# Patient Record
Sex: Male | Born: 1940 | Race: White | Hispanic: No | Marital: Married | Smoking: Former smoker
Health system: Southern US, Community
[De-identification: ages and names within clinical notes are randomized; demographics above are authoritative.]

## PROBLEM LIST (undated history)

## (undated) DIAGNOSIS — IMO0002 Reserved for concepts with insufficient information to code with codable children: Secondary | ICD-10-CM

## (undated) DIAGNOSIS — E785 Hyperlipidemia, unspecified: Secondary | ICD-10-CM

## (undated) DIAGNOSIS — C61 Malignant neoplasm of prostate: Secondary | ICD-10-CM

## (undated) DIAGNOSIS — N138 Other obstructive and reflux uropathy: Secondary | ICD-10-CM

## (undated) DIAGNOSIS — R351 Nocturia: Secondary | ICD-10-CM

## (undated) DIAGNOSIS — N401 Enlarged prostate with lower urinary tract symptoms: Secondary | ICD-10-CM

## (undated) DIAGNOSIS — M2550 Pain in unspecified joint: Secondary | ICD-10-CM

## (undated) DIAGNOSIS — IMO0001 Reserved for inherently not codable concepts without codable children: Secondary | ICD-10-CM

## (undated) DIAGNOSIS — Z972 Presence of dental prosthetic device (complete) (partial): Secondary | ICD-10-CM

## (undated) HISTORY — DX: Benign prostatic hyperplasia with lower urinary tract symptoms: N40.1

## (undated) HISTORY — PX: ESOPHAGOSCOPY: SUR460

## (undated) HISTORY — PX: POLYPECTOMY: SHX149

## (undated) HISTORY — PX: TONSILLECTOMY: SUR1361

## (undated) HISTORY — DX: Other obstructive and reflux uropathy: N13.8

## (undated) HISTORY — PX: CHOLECYSTECTOMY: SHX55

## (undated) HISTORY — DX: Hyperlipidemia, unspecified: E78.5

## (undated) HISTORY — DX: Malignant neoplasm of prostate: C61

## (undated) HISTORY — DX: Pain in unspecified joint: M25.50

## (undated) HISTORY — DX: Reserved for inherently not codable concepts without codable children: IMO0001

## (undated) HISTORY — PX: APPENDECTOMY: SHX54

## (undated) HISTORY — DX: Reserved for concepts with insufficient information to code with codable children: IMO0002

---

## 2002-07-19 ENCOUNTER — Encounter: Payer: Self-pay | Admitting: Internal Medicine

## 2002-07-19 ENCOUNTER — Encounter: Admission: RE | Admit: 2002-07-19 | Discharge: 2002-07-19 | Payer: Self-pay | Admitting: Internal Medicine

## 2005-02-02 ENCOUNTER — Ambulatory Visit: Payer: Self-pay | Admitting: Internal Medicine

## 2005-07-13 ENCOUNTER — Ambulatory Visit: Payer: Self-pay | Admitting: Unknown Physician Specialty

## 2006-05-15 ENCOUNTER — Ambulatory Visit: Payer: Self-pay | Admitting: Internal Medicine

## 2006-05-31 ENCOUNTER — Ambulatory Visit: Payer: Self-pay | Admitting: Internal Medicine

## 2007-03-14 ENCOUNTER — Ambulatory Visit: Payer: Self-pay | Admitting: Internal Medicine

## 2008-06-25 ENCOUNTER — Ambulatory Visit: Payer: Self-pay | Admitting: Unknown Physician Specialty

## 2008-07-23 ENCOUNTER — Ambulatory Visit: Payer: Self-pay | Admitting: Otolaryngology

## 2012-05-01 DIAGNOSIS — C61 Malignant neoplasm of prostate: Secondary | ICD-10-CM

## 2012-05-01 HISTORY — PX: PROSTATE BIOPSY: SHX241

## 2012-05-01 HISTORY — DX: Malignant neoplasm of prostate: C61

## 2012-06-27 ENCOUNTER — Encounter: Payer: Self-pay | Admitting: *Deleted

## 2012-06-27 DIAGNOSIS — N401 Enlarged prostate with lower urinary tract symptoms: Secondary | ICD-10-CM | POA: Insufficient documentation

## 2012-06-27 DIAGNOSIS — E785 Hyperlipidemia, unspecified: Secondary | ICD-10-CM | POA: Insufficient documentation

## 2012-06-27 DIAGNOSIS — M2550 Pain in unspecified joint: Secondary | ICD-10-CM | POA: Insufficient documentation

## 2012-06-27 DIAGNOSIS — C61 Malignant neoplasm of prostate: Secondary | ICD-10-CM | POA: Insufficient documentation

## 2012-06-27 NOTE — Progress Notes (Signed)
Married, retired from Public relations account executive- [employer]  03/09/12 PSA 5.66

## 2012-06-28 ENCOUNTER — Encounter: Payer: Self-pay | Admitting: Radiation Oncology

## 2012-06-28 ENCOUNTER — Ambulatory Visit
Admission: RE | Admit: 2012-06-28 | Discharge: 2012-06-28 | Disposition: A | Discharge status: Home / Self Care | Payer: Medicare Other | Source: Ambulatory Visit | Attending: Radiation Oncology | Admitting: Radiation Oncology

## 2012-06-28 VITALS — BP 129/83 | HR 61 | Temp 97.6°F | Resp 20 | Ht 71.0 in | Wt 201.1 lb

## 2012-06-28 DIAGNOSIS — C61 Malignant neoplasm of prostate: Secondary | ICD-10-CM

## 2012-06-28 DIAGNOSIS — Z510 Encounter for antineoplastic radiation therapy: Secondary | ICD-10-CM | POA: Insufficient documentation

## 2012-06-28 DIAGNOSIS — Z79899 Other long term (current) drug therapy: Secondary | ICD-10-CM | POA: Insufficient documentation

## 2012-06-28 DIAGNOSIS — E785 Hyperlipidemia, unspecified: Secondary | ICD-10-CM | POA: Insufficient documentation

## 2012-06-28 NOTE — Progress Notes (Signed)
[facility] Radiation Oncology NEW PATIENT EVALUATION  Name: [name] MRN: [id number]  Date:   06/28/2012           DOB: [birth year]  Status: outpatient   CC:  Valetta Fuller, MD Dr. Bethann Punches, [facility]   REFERRING PHYSICIAN: Valetta Fuller, MD   DIAGNOSIS: Stage TI C. intermediate risk adenocarcinoma prostate   HISTORY OF PRESENT ILLNESS:  [name] is a 71 y.o. male who is seen today for the courtesy of Dr. Isabel Caprice for discussion of possible treatment options in the management of his stage TI C. intermediate risk adenocarcinoma prostate he was referred by Dr. Bethann Punches to Dr. Isabel Caprice for an elevated PSA. He had 2 previous benign biopsies in [place] 5-10 years ago. His PSA was 5.4 this past February, after a course of antibiotics his PSA was 5.0. A repeat PSA by Dr. Isabel Caprice on 03/09/2012 was 5.66. He underwent ultrasound-guided biopsies on 05/01/2012 with 8 of 12 biopsies diagnostic for adenocarcinoma. He Gleason 6 (3+3) involving 40% of one core from right lateral base, 40% of one core from the right base, 10% of one core from the right mid gland, 5% of one core from the right lateral apex, temperature of one core from the right apex, 10% of one core from the left lateral base and less than 5% of one core from the left base. He also had Gleason 7 (3+4) involving 40% of one core from right lateral mid gland. His gland volume was approximately 55 cc. He does have moderate obstructive urinary symptomatology with an I PSS score of 12. This is while on Rapaflo along with Myrbetriq. He does have erectile dysfunction and has tried Cialis. No GI difficulties. PREVIOUS RADIATION THERAPY: No   PAST MEDICAL HISTORY:  has a past medical history of Other and unspecified hyperlipidemia; Joint pain; Hypertrophy of prostate with urinary obstruction and other lower urinary tract symptoms (LUTS); and Prostate cancer (05/01/12).     PAST SURGICAL HISTORY:  Past Surgical  History  Procedure Date  . Polypectomy     removed from throat  . Esophagoscopy   . Cholecystectomy 1990's    lap choley  . Appendectomy     50 yrs ago  . Tonsillectomy     50 yrs ago     FAMILY HISTORY: family history includes Cancer in his father; Diabetes in his brother; and Heart disease in his brother.   SOCIAL HISTORY:  reports that he quit smoking about 4 years ago. His smoking use included Cigarettes and Cigars. He has a 50 pack-year smoking history. He does not have any smokeless tobacco history on file. He reports that he does not drink alcohol or use illicit drugs.   ALLERGIES: Review of patient's allergies indicates no known allergies.   MEDICATIONS:  Current Outpatient Prescriptions  Medication Sig Dispense Refill  . mirabegron ER (MYRBETRIQ) 25 MG TB24 Take 25 mg by mouth daily.      . silodosin (RAPAFLO) 8 MG CAPS capsule Take 8 mg by mouth daily with breakfast.      . simvastatin (ZOCOR) 40 MG tablet Take 40 mg by mouth every evening.      . tadalafil (CIALIS) 5 MG tablet Take 5 mg by mouth daily as needed.         REVIEW OF SYSTEMS:  Pertinent items are noted in HPI.    PHYSICAL EXAM:  height is 5\' 11"  (1.803 m) and weight is 201 lb 1.6 oz (91.218 kg).  His oral temperature is 97.6 F (36.4 C). His blood pressure is 129/83 and his pulse is 61. His respiration is 20.   Head and neck examination: Grossly unremarkable. Nodes: Without palpable cervical or supraclavicular lymphadenopathy. Chest: Lungs clear. Back: Without spinal or CVA tenderness. Heart: Regular in rhythm. Abdomen: Without masses organomegaly. Genitalia: Unremarkable to inspection. Rectal: The prostate gland is slightly enlarged and is without focal induration or nodularity. Extremities: Without edema.   LABORATORY DATA:  No results found for this basename: WBC, HGB, HCT, MCV, PLT   No results found for this basename: NA, K, CL, CO2   No results found for this basename: ALT, AST, GGT, ALKPHOS,  BILITOT   PSA from 03/09/2012  5.66.   IMPRESSION: Stage TI C. intermediate risk adenocarcinoma prostate. I explained to the patient and his wife that his prognosis is related to his stage, PSA level, and Gleason score. His stage and PSA level are favorable while his Gleason score of 7 is of intermediate favorability. Other prognostic factors include disease volume and also PSA doubling time. He does have high disease volume for Gleason 6. Management options include surgery versus close surveillance versus radiation therapy. Radiation therapy options include seed implantation with or without 5 weeks of external beam radiation therapy or 8 weeks of external beam/IMRT, along the option of short term androgen deprivation therapy for 6 months. Seed implantation alone is certainly an option for patients with low volume Gleason 7, but considering his high volume Gleason 6, I would consider 5 weeks of external beam radiotherapy to make sure that there would be adequate coverage of his prostate base. Is quite possible that he would have to be "downsized" for seed implantation, and I would also be concerned about worsening of his obstructive symptomatology with recurrent I PSS score of 12 while on Rapaflo. After lengthy discussion he is more interested in 8 weeks of external beam/IMRT which I think would be reasonable .  I also gave him the option of short-term androgen deprivation therapy (6 months) which is currently being studied by the RTOG for intermediate risk patients. Single institutions have shown  a disease free survival benefit in the setting, and considering his disease volume, I feel the benefits would outweigh the potential short-term toxicities. We discussed the potential acute and late toxicities of radiation therapy and also short-term androgen deprivation therapy. He'll think things over and get back in touch with me if you would like to consider radiation therapy with or without androgen deprivation  therapy. He also states that he would need to have 3 gold seeds placed within the prostate for image guidance should he want to proceed with external beam radiation therapy.   PLAN: As discussed above.   I .spent 60 minutes minutes face to face with the patient and more than 50% of that time was spent in counseling and/or coordination of care.

## 2012-06-28 NOTE — Addendum Note (Signed)
Encounter addended by: Glennie Hawk, RN on: 06/28/2012  4:09 PM<BR>     Documentation filed: Charges VN

## 2012-06-28 NOTE — Progress Notes (Signed)
Please see the Nurse Progress Note in the MD Initial Consult Encounter for this patient. 

## 2012-06-29 NOTE — Addendum Note (Signed)
Encounter addended by: Devinne Epstein Mintz Cash Duce, RN on: 06/29/2012  5:41 PM<BR>     Documentation filed: Charges VN

## 2012-07-04 ENCOUNTER — Telehealth: Payer: Self-pay | Admitting: *Deleted

## 2012-07-04 ENCOUNTER — Encounter: Payer: Self-pay | Admitting: Radiation Oncology

## 2012-07-04 ENCOUNTER — Other Ambulatory Visit: Payer: Self-pay | Admitting: Radiation Oncology

## 2012-07-04 NOTE — Addendum Note (Signed)
Encounter addended by: Maryln Gottron, MD on: 07/04/2012  8:49 AM<BR>     Documentation filed: Normajean Glasgow VN

## 2012-07-04 NOTE — Telephone Encounter (Signed)
CALLED PATIENT TO INFORM OF GOLD SEED PLACEMENT - ON 08/23/12- 2:00 PM - ARRIVAL TIME - 1:45 PM AT DR. Ellin Goodie OFFICE AND HIS [facility] APPT. ON 08/28/12- ARRIVAL TIME - 1:30 PM, SPOKE WITH PATIENT AND HE IS AWARE OF THESE APPTS.

## 2012-07-04 NOTE — Progress Notes (Signed)
I spoke with [name] this morning and he would like to proceed with external beam/IMRT. He is interested in pursuing short-term (6 months) androgen deprivation therapy considering his disease volume in choosing IMRT. I will kindly asked Dr. Isabel Caprice to see him for initiation of androgen deprivation therapy, and I will get our schedulers to get him in some time within the next 2 months for placement of 3 gold seeds for image guidance. Followup visit with me in approximately 2 months at which time we'll get him scheduled for his CT simulation/treatment planning. Again, I reviewed the patient the potential acute and late toxicities of ration therapy and also short-term androgen deprivation therapy.

## 2012-08-23 HISTORY — PX: OTHER SURGICAL HISTORY: SHX169

## 2012-08-24 ENCOUNTER — Encounter: Payer: Self-pay | Admitting: Radiation Oncology

## 2012-08-28 ENCOUNTER — Encounter: Payer: Self-pay | Admitting: Radiation Oncology

## 2012-08-28 ENCOUNTER — Encounter: Payer: Self-pay | Admitting: *Deleted

## 2012-08-28 ENCOUNTER — Ambulatory Visit
Admission: RE | Admit: 2012-08-28 | Discharge: 2012-08-28 | Disposition: A | Discharge status: Home / Self Care | Payer: Medicare Other | Source: Ambulatory Visit | Attending: Radiation Oncology | Admitting: Radiation Oncology

## 2012-08-28 VITALS — BP 131/81 | HR 71 | Temp 98.0°F | Resp 20 | Ht 70.0 in | Wt 207.9 lb

## 2012-08-28 DIAGNOSIS — C61 Malignant neoplasm of prostate: Secondary | ICD-10-CM

## 2012-08-28 HISTORY — DX: Nocturia: R35.1

## 2012-08-28 NOTE — Progress Notes (Signed)
Followup note:  Diagnosis: Stage TI C. intermediate risk adenocarcinoma prostate  Requesting physician: Dr. Barron Alvine  History: Mr. Medlen visits today for review and scheduling of his radiation therapy in the management of his stage TI C. intermediate risk adenocarcinoma prostate. I saw the patient in consultation on 06/28/2012 at which time he presented with a PSA of 5.66 and Gleason 7/Gleason 6 adenocarcinoma prostate. Individual biopsies contained carcinoma. He elected short-term androgen deprivation therapy along with external beam/IMRT. His I PSS score that time was 12. Since then he stopped his Rapaflo approximately 2 weeks ago and has not noted any significant change in his urination. He was started on Trelstar androgen deprivation therapy on September 4. Dr. Isabel Caprice was kind enough to place 3 gold seeds for image guidance on 08/23/2012. He denies any change in his GU/GI habits. He is now off Rapaflo. He does admit to occasional hot flashes but these are not bothersome.  Physical examination: He appears well. Wt Readings from Last 3 Encounters:  08/28/12 207 lb 14.4 oz (94.303 kg)  06/28/12 201 lb 1.6 oz (91.218 kg)   Temp Readings from Last 3 Encounters:  08/28/12 98 F (36.7 C) Oral  06/28/12 97.6 F (36.4 C) Oral   BP Readings from Last 3 Encounters:  08/28/12 131/81  06/28/12 129/83   Pulse Readings from Last 3 Encounters:  08/28/12 71  06/28/12 61    Rectal examination: The prostate gland is palpably smaller and is without focal induration or nodularity.  Impression: Stage T1c intermediate risk adenocarcinoma prostate. I'll plan is to continue with short-term (6 months) in her deprivation therapy and begin his radiation therapy later November. He tells me that he would like to wait until after the Thanksgiving holiday before beginning his radiation therapy. Therefore, I will have him return for simulation/treatment planning the week of November 11. We discussed the  potential acute and late toxicities of radiation therapy and consent is signed today.  30 minutes was spent face-to-face with the patient, primarily counseling the patient.

## 2012-08-28 NOTE — Progress Notes (Signed)
FUNC prostate Ca   DX=05/01/12, gleason 3+4=7 & 3+3=6,PSA=5.66,volume 55cc  Had 3 gold seeds markers implanted 08/23/12 Dr/Grapey Last injection Trelstar 07/04/12 Alert,oriented x3 No dysuria noctura x3, urgency more so , feels like not emptying his bladder when he voids, weak stream, normal bowel movements      Allergies:NKDA

## 2012-09-07 ENCOUNTER — Telehealth: Payer: Self-pay | Admitting: *Deleted

## 2012-09-07 NOTE — Telephone Encounter (Signed)
CALLED PATIENT TO REMIND OF APPT. FOR 09-10-12, SPOKE WITH PATIENT AND HE IS AWARE OF THIS APPT.

## 2012-09-07 NOTE — Telephone Encounter (Signed)
XXXX 

## 2012-09-10 ENCOUNTER — Ambulatory Visit
Admission: RE | Admit: 2012-09-10 | Discharge: 2012-09-10 | Disposition: A | Discharge status: Home / Self Care | Payer: Medicare Other | Source: Ambulatory Visit | Attending: Radiation Oncology | Admitting: Radiation Oncology

## 2012-09-10 ENCOUNTER — Telehealth: Payer: Self-pay | Admitting: Radiation Oncology

## 2012-09-10 DIAGNOSIS — C61 Malignant neoplasm of prostate: Secondary | ICD-10-CM

## 2012-09-10 NOTE — Telephone Encounter (Signed)
Met with patient to discuss RO billing.  Patient had no questions or concerns today.

## 2012-09-10 NOTE — Progress Notes (Signed)
Simulation/treatment planning note: The patient was simulation/treatment planning in the management of his carcinoma the prostate. A Vaculoc immobilization device was constructed. A red rubber tube was placed within the rectal vault. He was then catheterized and contrast instilled into the bladder/urethra. He was then scanned. I chose an arbitrary isocenter in the center of the prostate. I contoured the prostate, seminal vesicles, bladder, rectum, and rectosigmoid colon. I prescribing 7800 cGy in 40 sessions to the prostate PTV which represents the prostate was 0.8 cm except for 0.57 m along the rectum. I prescribing 5600 cGy in 40 sessions to his seminal vesicles which include the seminal vesicles was 0.5 cm. He'll undergo daily image guidance with KV imaging any weekly cone beam CT to assess his bladder filling. He is now ready for IMRT simulation/treatment planning.

## 2012-09-12 ENCOUNTER — Encounter: Payer: Self-pay | Admitting: Radiation Oncology

## 2012-09-12 NOTE — Progress Notes (Signed)
IMRT simulation/treatment planning note: The patient underwent IMRT simulation/treatment planning in the management of his carcinoma the prostate. IMRT was chosen to decrease her risk for both acute and late bladder and rectal toxicity compared to conventional or 3-D conformal radiation therapy. Dose volume histograms were obtained for the target structures including the prostate and seminal vesicle PTV and also avoidance structures including the bladder, rectum, and femoral heads. We made a compromise between coverage of the prostate PTV and the rectal avoidance structure to meet our departmental goals. 98% of the prostate PTV must the rectum is received the prescribed dose of 7800 cGy in 40 sessions. Please see the electronic medical record for specific dose volume histograms. The patient will undergo daily image guidance setting up to his 3 gold seeds and also weekly cone beam CT to assess his bladder filling. He is be treated with a comfortably full bladder.

## 2012-09-19 ENCOUNTER — Ambulatory Visit: Payer: Medicare Other

## 2012-09-20 ENCOUNTER — Ambulatory Visit: Payer: Medicare Other

## 2012-09-21 ENCOUNTER — Ambulatory Visit: Payer: Medicare Other

## 2012-09-24 ENCOUNTER — Ambulatory Visit: Payer: Medicare Other

## 2012-09-25 ENCOUNTER — Ambulatory Visit: Payer: Medicare Other

## 2012-09-26 ENCOUNTER — Ambulatory Visit: Payer: Medicare Other

## 2012-09-28 ENCOUNTER — Ambulatory Visit: Payer: Medicare Other

## 2012-10-01 ENCOUNTER — Encounter: Payer: Self-pay | Admitting: Radiation Oncology

## 2012-10-01 ENCOUNTER — Ambulatory Visit
Admission: RE | Admit: 2012-10-01 | Discharge: 2012-10-01 | Disposition: A | Discharge status: Home / Self Care | Payer: Medicare Other | Source: Ambulatory Visit | Attending: Radiation Oncology | Admitting: Radiation Oncology

## 2012-10-01 VITALS — BP 132/85 | HR 98 | Temp 99.1°F | Resp 20 | Wt 209.4 lb

## 2012-10-01 DIAGNOSIS — Z79899 Other long term (current) drug therapy: Secondary | ICD-10-CM | POA: Insufficient documentation

## 2012-10-01 DIAGNOSIS — C61 Malignant neoplasm of prostate: Secondary | ICD-10-CM | POA: Insufficient documentation

## 2012-10-01 DIAGNOSIS — E785 Hyperlipidemia, unspecified: Secondary | ICD-10-CM | POA: Insufficient documentation

## 2012-10-01 DIAGNOSIS — Z510 Encounter for antineoplastic radiation therapy: Secondary | ICD-10-CM | POA: Insufficient documentation

## 2012-10-01 NOTE — Progress Notes (Signed)
Weekly Management Note:  Site: Prostate Current Dose:  195  cGy Projected Dose: 7800  cGy  Narrative: The patient is seen today for routine under treatment assessment. CBCT/MVCT images/port films were reviewed. The chart was reviewed.   Satisfactory bladder filling today. He does have nocturia x2-3 as his baseline.  Physical Examination:  Filed Vitals:   10/01/12 1107  BP: 132/85  Pulse: 98  Temp: 99.1 F (37.3 C)  Resp: 20  .  Weight: 209 lb 6.4 oz (94.983 kg). No change .  Impression: Tolerating radiation therapy well.  Plan: Continue radiation therapy as planned.

## 2012-10-01 NOTE — Progress Notes (Signed)
Addendum, patient corrected he is taking rapaflo instead of flomax 11:12 AM

## 2012-10-01 NOTE — Progress Notes (Signed)
Patient here rad txs, prostate, 1/40completed, post sim teaching, no c/o ,regular bowel movements, on flomax already, no dysuria, nocturia 3x Teach back 11:10 AM

## 2012-10-02 ENCOUNTER — Ambulatory Visit
Admission: RE | Admit: 2012-10-02 | Discharge: 2012-10-02 | Disposition: A | Discharge status: Home / Self Care | Payer: Medicare Other | Source: Ambulatory Visit | Attending: Radiation Oncology | Admitting: Radiation Oncology

## 2012-10-03 ENCOUNTER — Ambulatory Visit
Admission: RE | Admit: 2012-10-03 | Discharge: 2012-10-03 | Disposition: A | Discharge status: Home / Self Care | Payer: Medicare Other | Source: Ambulatory Visit | Attending: Radiation Oncology | Admitting: Radiation Oncology

## 2012-10-04 ENCOUNTER — Ambulatory Visit
Admission: RE | Admit: 2012-10-04 | Discharge: 2012-10-04 | Disposition: A | Discharge status: Home / Self Care | Payer: Medicare Other | Source: Ambulatory Visit | Attending: Radiation Oncology | Admitting: Radiation Oncology

## 2012-10-05 ENCOUNTER — Ambulatory Visit
Admission: RE | Admit: 2012-10-05 | Discharge: 2012-10-05 | Disposition: A | Discharge status: Home / Self Care | Payer: Medicare Other | Source: Ambulatory Visit | Attending: Radiation Oncology | Admitting: Radiation Oncology

## 2012-10-08 ENCOUNTER — Encounter: Payer: Self-pay | Admitting: Radiation Oncology

## 2012-10-08 ENCOUNTER — Ambulatory Visit
Admission: RE | Admit: 2012-10-08 | Discharge: 2012-10-08 | Disposition: A | Discharge status: Home / Self Care | Payer: Medicare Other | Source: Ambulatory Visit | Attending: Radiation Oncology | Admitting: Radiation Oncology

## 2012-10-08 VITALS — BP 146/80 | HR 89 | Temp 97.9°F | Resp 20 | Wt 213.0 lb

## 2012-10-08 DIAGNOSIS — C61 Malignant neoplasm of prostate: Secondary | ICD-10-CM

## 2012-10-08 NOTE — Progress Notes (Signed)
Weekly Management Note:  Site: Prostate Current Dose:  1170  cGy Projected Dose: 7800  cGy  Narrative: The patient is seen today for routine under treatment assessment. CBCT/MVCT images/port films were reviewed. The chart was reviewed.   Bladder filling is satisfactory. No new GU or GI difficulties  Physical Examination:  Filed Vitals:   10/08/12 1113  BP: 146/80  Pulse: 89  Temp: 97.9 F (36.6 C)  Resp: 20  .  Weight: 213 lb (96.616 kg). No change.  Impression: Tolerating radiation therapy well.  Plan: Continue radiation therapy as planned.

## 2012-10-08 NOTE — Progress Notes (Signed)
Here for weekly rad ZOX:WRUEAVWU=9/81 completed, nocturia still 2x  Sometimes 3, no c/o pain, regular bowel movements, no dysuria 11:13 AM

## 2012-10-09 ENCOUNTER — Ambulatory Visit
Admission: RE | Admit: 2012-10-09 | Discharge: 2012-10-09 | Disposition: A | Discharge status: Home / Self Care | Payer: Medicare Other | Source: Ambulatory Visit | Attending: Radiation Oncology | Admitting: Radiation Oncology

## 2012-10-10 ENCOUNTER — Ambulatory Visit
Admission: RE | Admit: 2012-10-10 | Discharge: 2012-10-10 | Disposition: A | Discharge status: Home / Self Care | Payer: Medicare Other | Source: Ambulatory Visit | Attending: Radiation Oncology | Admitting: Radiation Oncology

## 2012-10-11 ENCOUNTER — Ambulatory Visit
Admission: RE | Admit: 2012-10-11 | Discharge: 2012-10-11 | Disposition: A | Discharge status: Home / Self Care | Payer: Medicare Other | Source: Ambulatory Visit | Attending: Radiation Oncology | Admitting: Radiation Oncology

## 2012-10-12 ENCOUNTER — Ambulatory Visit
Admission: RE | Admit: 2012-10-12 | Discharge: 2012-10-12 | Disposition: A | Discharge status: Home / Self Care | Payer: Medicare Other | Source: Ambulatory Visit | Attending: Radiation Oncology | Admitting: Radiation Oncology

## 2012-10-15 ENCOUNTER — Ambulatory Visit
Admission: RE | Admit: 2012-10-15 | Discharge: 2012-10-15 | Disposition: A | Discharge status: Home / Self Care | Payer: Medicare Other | Source: Ambulatory Visit | Attending: Radiation Oncology | Admitting: Radiation Oncology

## 2012-10-15 ENCOUNTER — Encounter: Payer: Self-pay | Admitting: Radiation Oncology

## 2012-10-15 VITALS — BP 150/78 | HR 74 | Temp 98.8°F | Resp 20 | Wt 215.7 lb

## 2012-10-15 DIAGNOSIS — C61 Malignant neoplasm of prostate: Secondary | ICD-10-CM

## 2012-10-15 NOTE — Progress Notes (Signed)
Patient here for prostate  rad tx 11/40 completed, had diarrhea Sat morning  And Sunday, he thinks [place] food helped with this, does have hesitancy and nocturia numerous times , ate fried chicken and fat back yesterday,which contributed to his diarrhea, hasn't taken anything to slow this down 11:09 AM

## 2012-10-15 NOTE — Progress Notes (Signed)
Weekly Management Note:  Site: Prostate Current Dose:  2145  cGy Projected Dose: 7800  cGy  Narrative: The patient is seen today for routine under treatment assessment. CBCT/MVCT images/port films were reviewed. The chart was reviewed.   Excellent bladder filling. No new GU or GI difficulties.  Physical Examination:  Filed Vitals:   10/15/12 1105  BP: 150/78  Pulse: 74  Temp: 98.8 F (37.1 C)  Resp: 20  .  Weight: 215 lb 11.2 oz (97.841 kg). No change  Impression: Tolerating radiation therapy well.  Plan: Continue radiation therapy as planned.

## 2012-10-16 ENCOUNTER — Ambulatory Visit
Admission: RE | Admit: 2012-10-16 | Discharge: 2012-10-16 | Disposition: A | Discharge status: Home / Self Care | Payer: Medicare Other | Source: Ambulatory Visit | Attending: Radiation Oncology | Admitting: Radiation Oncology

## 2012-10-17 ENCOUNTER — Ambulatory Visit
Admission: RE | Admit: 2012-10-17 | Discharge: 2012-10-17 | Disposition: A | Discharge status: Home / Self Care | Payer: Medicare Other | Source: Ambulatory Visit | Attending: Radiation Oncology | Admitting: Radiation Oncology

## 2012-10-18 ENCOUNTER — Ambulatory Visit
Admission: RE | Admit: 2012-10-18 | Discharge: 2012-10-18 | Disposition: A | Discharge status: Home / Self Care | Payer: Medicare Other | Source: Ambulatory Visit | Attending: Radiation Oncology | Admitting: Radiation Oncology

## 2012-10-19 ENCOUNTER — Ambulatory Visit
Admission: RE | Admit: 2012-10-19 | Discharge: 2012-10-19 | Disposition: A | Discharge status: Home / Self Care | Payer: Medicare Other | Source: Ambulatory Visit | Attending: Radiation Oncology | Admitting: Radiation Oncology

## 2012-10-22 ENCOUNTER — Ambulatory Visit
Admission: RE | Admit: 2012-10-22 | Discharge: 2012-10-22 | Disposition: A | Discharge status: Home / Self Care | Payer: Medicare Other | Source: Ambulatory Visit | Attending: Radiation Oncology | Admitting: Radiation Oncology

## 2012-10-22 ENCOUNTER — Encounter: Payer: Self-pay | Admitting: Radiation Oncology

## 2012-10-22 VITALS — BP 153/84 | HR 66 | Temp 97.9°F | Resp 20 | Wt 214.2 lb

## 2012-10-22 DIAGNOSIS — C61 Malignant neoplasm of prostate: Secondary | ICD-10-CM

## 2012-10-22 NOTE — Progress Notes (Signed)
Patient here weekly rad txs, prosatte, 16/40 completed, nocturia x2, no dysuria, regular bowel movements

## 2012-10-22 NOTE — Progress Notes (Signed)
   Weekly Management Note:  outpatient, prostate Current Dose:  31.2 Gy  Projected Dose: 78 Gy   Narrative:  The patient presents for routine under treatment assessment.  CBCT/MVCT images/Port film x-rays were reviewed.  The chart was checked. He is doing well. He has regular bowel movements. No dysuria. Nocturia once or twice a night.  Physical Findings:  weight is 214 lb 3.2 oz (97.16 kg). His oral temperature is 97.9 F (36.6 C). His blood pressure is 153/84 and his pulse is 66. His respiration is 20.  well-appearing in no acute distress   Impression:  The patient is tolerating radiotherapy.  Plan:  Continue radiotherapy as planned.  ________________________________   Lonie Peak, M.D.

## 2012-10-23 ENCOUNTER — Ambulatory Visit
Admission: RE | Admit: 2012-10-23 | Discharge: 2012-10-23 | Disposition: A | Discharge status: Home / Self Care | Payer: Medicare Other | Source: Ambulatory Visit | Attending: Radiation Oncology | Admitting: Radiation Oncology

## 2012-10-25 ENCOUNTER — Ambulatory Visit
Admission: RE | Admit: 2012-10-25 | Discharge: 2012-10-25 | Disposition: A | Discharge status: Home / Self Care | Payer: Medicare Other | Source: Ambulatory Visit | Attending: Radiation Oncology | Admitting: Radiation Oncology

## 2012-10-26 ENCOUNTER — Ambulatory Visit
Admission: RE | Admit: 2012-10-26 | Discharge: 2012-10-26 | Disposition: A | Discharge status: Home / Self Care | Payer: Medicare Other | Source: Ambulatory Visit | Attending: Radiation Oncology | Admitting: Radiation Oncology

## 2012-10-29 ENCOUNTER — Ambulatory Visit
Admission: RE | Admit: 2012-10-29 | Discharge: 2012-10-29 | Disposition: A | Discharge status: Home / Self Care | Payer: Medicare Other | Source: Ambulatory Visit | Attending: Radiation Oncology | Admitting: Radiation Oncology

## 2012-10-29 ENCOUNTER — Encounter: Payer: Self-pay | Admitting: Radiation Oncology

## 2012-10-29 VITALS — BP 117/57 | HR 69 | Resp 18 | Wt 216.3 lb

## 2012-10-29 DIAGNOSIS — C61 Malignant neoplasm of prostate: Secondary | ICD-10-CM

## 2012-10-29 NOTE — Progress Notes (Signed)
Weekly Management Note:  Site: Prostate Current Dose:  3900  cGy Projected Dose: 7800  cGy  Narrative: The patient is seen today for routine under treatment assessment. CBCT/MVCT images/port films were reviewed. The chart was reviewed.   Bladder filling is satisfactory. Last week he did get up to 10 times during the night. He does have some slowing of his urinary stream despite being on Flomax once a day. No GI difficulties.  Physical Examination:  Filed Vitals:   10/29/12 1059  BP: 117/57  Pulse: 69  Resp: 18  .  Weight: 216 lb 4.8 oz (98.113 kg). No change.  Impression: Tolerating radiation therapy well, however he is having more obstructive symptoms. I will increase his Flomax to twice a day dosing  Plan: Continue radiation therapy as planned.

## 2012-10-29 NOTE — Progress Notes (Signed)
Patient presents to the clinic today accompanied by his wife for a PUT with Dr. Dayton Scrape. Patient alert and oriented to person, place, and time. No distress noted. Steady gait noted. Pleasant affect noted. Patient denies pain at this time. Patient reports that Friday following a week of radiation he was experiencing urinary frequency (voided 10 times during the night), diarrhea, difficulty emptying bladder completely, and weak urine stream. Patient reports today that urine stream is back strong and he only got up to void three times during the night. Patient denies hematuria. Patient denies burning with urination. Patient reports diarrhea has resolved. Reported all findings to Dr. Dayton Scrape.

## 2012-10-30 ENCOUNTER — Ambulatory Visit
Admission: RE | Admit: 2012-10-30 | Discharge: 2012-10-30 | Disposition: A | Discharge status: Home / Self Care | Payer: Medicare Other | Source: Ambulatory Visit | Attending: Radiation Oncology | Admitting: Radiation Oncology

## 2012-11-01 ENCOUNTER — Ambulatory Visit
Admission: RE | Admit: 2012-11-01 | Discharge: 2012-11-01 | Disposition: A | Discharge status: Home / Self Care | Payer: Medicare Other | Source: Ambulatory Visit | Attending: Radiation Oncology | Admitting: Radiation Oncology

## 2012-11-02 ENCOUNTER — Ambulatory Visit
Admission: RE | Admit: 2012-11-02 | Discharge: 2012-11-02 | Disposition: A | Discharge status: Home / Self Care | Payer: Medicare Other | Source: Ambulatory Visit | Attending: Radiation Oncology | Admitting: Radiation Oncology

## 2012-11-05 ENCOUNTER — Ambulatory Visit
Admission: RE | Admit: 2012-11-05 | Discharge: 2012-11-05 | Disposition: A | Discharge status: Home / Self Care | Payer: Medicare Other | Source: Ambulatory Visit | Attending: Radiation Oncology | Admitting: Radiation Oncology

## 2012-11-05 VITALS — BP 152/78 | HR 75 | Temp 99.3°F | Resp 20 | Wt 214.5 lb

## 2012-11-05 DIAGNOSIS — C61 Malignant neoplasm of prostate: Secondary | ICD-10-CM

## 2012-11-05 NOTE — Progress Notes (Signed)
Patient here weekly rad txs, 24/40 prostate completed,  On rapaflo daily states"I need rx called to [facility]" no dysuria, no pain, eating and drinking enough water 11:23 AM

## 2012-11-05 NOTE — Progress Notes (Signed)
Weekly Management Note:  Site: Prostate Current Dose:  4680  cGy Projected Dose: 7800  cGy  Narrative: The patient is seen today for routine under treatment assessment. CBCT/MVCT images/port films were reviewed. The chart was reviewed.   I'm unable to review his cone beam CT scan secondary to IT issues. No significant GU or GI difficulties. He continues with his Rapaflo.  Physical Examination:  Filed Vitals:   11/05/12 1100  BP: 152/78  Pulse: 75  Temp: 99.3 F (37.4 C)  Resp: 20  .  Weight: 214 lb 8 oz (97.297 kg). No change.  Impression: Tolerating radiation therapy well.  Plan: Continue radiation therapy as planned.

## 2012-11-06 ENCOUNTER — Ambulatory Visit
Admission: RE | Admit: 2012-11-06 | Discharge: 2012-11-06 | Disposition: A | Discharge status: Home / Self Care | Payer: Medicare Other | Source: Ambulatory Visit | Attending: Radiation Oncology | Admitting: Radiation Oncology

## 2012-11-07 ENCOUNTER — Ambulatory Visit
Admission: RE | Admit: 2012-11-07 | Discharge: 2012-11-07 | Disposition: A | Discharge status: Home / Self Care | Payer: Medicare Other | Source: Ambulatory Visit | Attending: Radiation Oncology | Admitting: Radiation Oncology

## 2012-11-08 ENCOUNTER — Ambulatory Visit
Admission: RE | Admit: 2012-11-08 | Discharge: 2012-11-08 | Disposition: A | Discharge status: Home / Self Care | Payer: Medicare Other | Source: Ambulatory Visit | Attending: Radiation Oncology | Admitting: Radiation Oncology

## 2012-11-09 ENCOUNTER — Ambulatory Visit
Admission: RE | Admit: 2012-11-09 | Discharge: 2012-11-09 | Disposition: A | Discharge status: Home / Self Care | Payer: Medicare Other | Source: Ambulatory Visit | Attending: Radiation Oncology | Admitting: Radiation Oncology

## 2012-11-12 ENCOUNTER — Encounter: Payer: Self-pay | Admitting: Radiation Oncology

## 2012-11-12 ENCOUNTER — Ambulatory Visit
Admission: RE | Admit: 2012-11-12 | Discharge: 2012-11-12 | Disposition: A | Discharge status: Home / Self Care | Payer: Medicare Other | Source: Ambulatory Visit | Attending: Radiation Oncology | Admitting: Radiation Oncology

## 2012-11-12 VITALS — BP 129/84 | HR 78 | Temp 98.5°F | Resp 20 | Wt 216.9 lb

## 2012-11-12 DIAGNOSIS — C61 Malignant neoplasm of prostate: Secondary | ICD-10-CM

## 2012-11-12 NOTE — Progress Notes (Signed)
Pt denies urinary issues, states he has had episodes of loose stools w/ difficulty controlling. He had not required Imodium. Denies loss of appetite, fatigue.

## 2012-11-12 NOTE — Progress Notes (Signed)
Weekly Management Note:  Site: Prostate Current Dose:  5655  cGy Projected Dose: 7800  cGy  Narrative: The patient is seen today for routine under treatment assessment. CBCT/MVCT images/port films were reviewed. The chart was reviewed. Bladder filling has been satisfactory.  He is generally doing well from a GU and GI standpoint. He does have occasional loosening of his bowels. Nocturia x3. He is one more "hormone shot".  Physical Examination:  Filed Vitals:   11/12/12 1111  BP: 129/84  Pulse: 78  Temp: 98.5 F (36.9 C)  Resp: 20  .  Weight: 216 lb 14.4 oz (98.385 kg). No change  Impression: Tolerating radiation therapy well.  Plan: Continue radiation therapy as planned.

## 2012-11-13 ENCOUNTER — Ambulatory Visit
Admission: RE | Admit: 2012-11-13 | Discharge: 2012-11-13 | Disposition: A | Discharge status: Home / Self Care | Payer: Medicare Other | Source: Ambulatory Visit | Attending: Radiation Oncology | Admitting: Radiation Oncology

## 2012-11-14 ENCOUNTER — Ambulatory Visit
Admission: RE | Admit: 2012-11-14 | Discharge: 2012-11-14 | Disposition: A | Discharge status: Home / Self Care | Payer: Medicare Other | Source: Ambulatory Visit | Attending: Radiation Oncology | Admitting: Radiation Oncology

## 2012-11-15 ENCOUNTER — Ambulatory Visit
Admission: RE | Admit: 2012-11-15 | Discharge: 2012-11-15 | Disposition: A | Discharge status: Home / Self Care | Payer: Medicare Other | Source: Ambulatory Visit | Attending: Radiation Oncology | Admitting: Radiation Oncology

## 2012-11-16 ENCOUNTER — Ambulatory Visit
Admission: RE | Admit: 2012-11-16 | Discharge: 2012-11-16 | Disposition: A | Discharge status: Home / Self Care | Payer: Medicare Other | Source: Ambulatory Visit | Attending: Radiation Oncology | Admitting: Radiation Oncology

## 2012-11-19 ENCOUNTER — Ambulatory Visit
Admission: RE | Admit: 2012-11-19 | Discharge: 2012-11-19 | Disposition: A | Discharge status: Home / Self Care | Payer: Medicare Other | Source: Ambulatory Visit | Attending: Radiation Oncology | Admitting: Radiation Oncology

## 2012-11-19 ENCOUNTER — Encounter: Payer: Self-pay | Admitting: Radiation Oncology

## 2012-11-19 VITALS — BP 134/79 | HR 80 | Temp 98.3°F | Resp 20 | Wt 215.1 lb

## 2012-11-19 DIAGNOSIS — C61 Malignant neoplasm of prostate: Secondary | ICD-10-CM

## 2012-11-19 NOTE — Progress Notes (Signed)
Pt states no new problems or issues w/urination, bowels. He denies pain, loss of appetite, fatigue.

## 2012-11-19 NOTE — Progress Notes (Signed)
Weekly Management Note:  Site: Prostate Current Dose:  6630  cGy Projected Dose: 7800  cGy  Narrative: The patient is seen today for routine under treatment assessment. CBCT/MVCT images/port films were reviewed. The chart was reviewed.   Bladder filling is satisfactory. His urination is worse by the end of the week but improves by Monday. He continues with his Rapaflo twice a day. His nocturia various from 3-10 depending on the time of a week. No GI difficulties appear  Physical Examination:  Filed Vitals:   11/19/12 1056  BP: 134/79  Pulse: 80  Temp: 98.3 F (36.8 C)  Resp: 20  .  Weight: 215 lb 1.6 oz (97.569 kg). No change.  Impression: Tolerating radiation therapy well.  Plan: Continue radiation therapy as planned.

## 2012-11-20 ENCOUNTER — Ambulatory Visit
Admission: RE | Admit: 2012-11-20 | Discharge: 2012-11-20 | Disposition: A | Discharge status: Home / Self Care | Payer: Medicare Other | Source: Ambulatory Visit | Attending: Radiation Oncology | Admitting: Radiation Oncology

## 2012-11-21 ENCOUNTER — Ambulatory Visit
Admission: RE | Admit: 2012-11-21 | Discharge: 2012-11-21 | Disposition: A | Discharge status: Home / Self Care | Payer: Medicare Other | Source: Ambulatory Visit | Attending: Radiation Oncology | Admitting: Radiation Oncology

## 2012-11-22 ENCOUNTER — Ambulatory Visit
Admission: RE | Admit: 2012-11-22 | Discharge: 2012-11-22 | Disposition: A | Discharge status: Home / Self Care | Payer: Medicare Other | Source: Ambulatory Visit | Attending: Radiation Oncology | Admitting: Radiation Oncology

## 2012-11-23 ENCOUNTER — Ambulatory Visit
Admission: RE | Admit: 2012-11-23 | Discharge: 2012-11-23 | Disposition: A | Discharge status: Home / Self Care | Payer: Medicare Other | Source: Ambulatory Visit | Attending: Radiation Oncology | Admitting: Radiation Oncology

## 2012-11-25 ENCOUNTER — Encounter: Payer: Self-pay | Admitting: Radiation Oncology

## 2012-11-25 NOTE — Progress Notes (Signed)
Chart note: On 10/01/2012 the patient began his IMRT in the management of his carcinoma the prostate. He was treated with 2 modulated arcs with dynamic multileaf collimator settings representing one set of IMRT treatment devices [phone]).

## 2012-11-26 ENCOUNTER — Encounter: Payer: Self-pay | Admitting: Radiation Oncology

## 2012-11-26 ENCOUNTER — Ambulatory Visit
Admission: RE | Admit: 2012-11-26 | Discharge: 2012-11-26 | Disposition: A | Discharge status: Home / Self Care | Payer: Medicare Other | Source: Ambulatory Visit | Attending: Radiation Oncology | Admitting: Radiation Oncology

## 2012-11-26 VITALS — BP 128/69 | HR 74 | Temp 98.7°F | Resp 20 | Wt 215.4 lb

## 2012-11-26 DIAGNOSIS — C61 Malignant neoplasm of prostate: Secondary | ICD-10-CM

## 2012-11-26 NOTE — Progress Notes (Signed)
Pt states "everything is the same, no new problems". Pt denies pain, fatigue, loss of appetite. Pt completes tomorrow, gave FU card.

## 2012-11-26 NOTE — Progress Notes (Signed)
Weekly Management Note:  Site: Prostate Current Dose:  7605  cGy Projected Dose: 7800  cGy  Narrative: The patient is seen today for routine under treatment assessment. CBCT/MVCT images/port films were reviewed. The chart was reviewed.   Bladder filling is excellent today. No new GU or GI difficulties. He will finish his radiation therapy tomorrow.  Physical Examination:  Filed Vitals:   11/26/12 1116  BP: 128/69  Pulse: 74  Temp: 98.7 F (37.1 C)  Resp: 20  .  Weight: 215 lb 6.4 oz (97.705 kg). No change.  Impression: Tolerating radiation therapy well. To finish radiation therapy tomorrow.  Plan: Continue radiation therapy as planned. One-month followup visit after completion of radiation therapy.

## 2012-11-27 ENCOUNTER — Encounter: Payer: Self-pay | Admitting: Radiation Oncology

## 2012-11-27 ENCOUNTER — Ambulatory Visit
Admission: RE | Admit: 2012-11-27 | Discharge: 2012-11-27 | Disposition: A | Discharge status: Home / Self Care | Payer: Medicare Other | Source: Ambulatory Visit | Attending: Radiation Oncology | Admitting: Radiation Oncology

## 2012-11-27 NOTE — Progress Notes (Signed)
[facility] Radiation Oncology End of Treatment Note  Name:[name]  Date: 11/27/2012 ZOX:[id number] DOB:10/29/1941   Status:outpatient    CC: Dr. Barron Alvine, Dr. Dondra Prader  REFERRING PHYSICIAN:  Dr. Barron Alvine   DIAGNOSIS: Stage TI C. intermediate risk adenocarcinoma prostate   INDICATION FOR TREATMENT: Curative   TREATMENT DATES: 10/02/2012 through 11/27/2012                           SITE/DOSE:   Prostate 7800 cGy in 40 sessions, seminal vesicles 5600 cGy 40 sessions                         BEAMS/ENERGY:   6 MV photons, dual ARC IMRT, daily image guidance               NARRATIVE:   The patient tolerated treatment well with only minimal worsening of his urinary obstructive symptoms by completion of therapy. He continued with Rapaflo.                         PLAN: Routine followup in one month. Patient instructed to call if questions or worsening complaints in interim.

## 2013-01-08 ENCOUNTER — Encounter: Payer: Self-pay | Admitting: Oncology

## 2013-01-09 ENCOUNTER — Encounter: Payer: Self-pay | Admitting: Radiation Oncology

## 2013-01-09 ENCOUNTER — Ambulatory Visit
Admission: RE | Admit: 2013-01-09 | Discharge: 2013-01-09 | Disposition: A | Discharge status: Home / Self Care | Payer: Medicare Other | Source: Ambulatory Visit | Attending: Radiation Oncology | Admitting: Radiation Oncology

## 2013-01-09 VITALS — BP 124/77 | HR 79 | Temp 98.0°F | Wt 214.9 lb

## 2013-01-09 DIAGNOSIS — C61 Malignant neoplasm of prostate: Secondary | ICD-10-CM

## 2013-01-09 NOTE — Progress Notes (Signed)
[name] here for follow up after radiation to his prostate.  He denies pain, frequency and hematuria.  He does have some trouble maintaining his urinary stream once it starts.  He usually gets up once a night to urinate.  He denies any bowel problems.  He states that he does have some fatigue.

## 2013-01-09 NOTE — Progress Notes (Signed)
CC: Dr. Barron Alvine  Followup note:  [name] approximately 6 weeks following completion of external beam/IMRT in the management of his stage TI C. intermediate risk adenocarcinoma prostate. He has been on short-term androgen deprivation therapy. He is doing well from a GU and GI standpoint although he does remain somewhat fatigued, presumably from his androgen deprivation therapy. He stopped his Rapaflo week ago and is doing well from a GU standpoint. He has nocturia x1. He sees Dr. Isabel Hurley early next month.  Physical examination: Wt Readings from Last 3 Encounters:  01/09/13 214 lb 14.4 oz (97.478 kg)  11/26/12 215 lb 6.4 oz (97.705 kg)  11/19/12 215 lb 1.6 oz (97.569 kg)   Temp Readings from Last 3 Encounters:  01/09/13 98 F (36.7 C)   11/26/12 98.7 F (37.1 C) Oral  11/19/12 98.3 F (36.8 C) Oral   BP Readings from Last 3 Encounters:  01/09/13 124/77  11/26/12 128/69  11/19/12 134/79   Pulse Readings from Last 3 Encounters:  01/09/13 79  11/26/12 74  11/19/12 80   He's not examined today  Impression: Satisfactory progress with return of GU and GI habits to  preradiation baseline.   Plan: He'll maintain his followup with Dr. Isabel Hurley who he will see next month. I do not feel that he needs to continue with androgen deprivation therapy. I think this is responsible for his fatigue. I've not scheduled the patient for a formal followup visit and I ask that Dr. Isabel Hurley keep me posted on his progress.

## 2013-03-29 ENCOUNTER — Ambulatory Visit: Payer: Self-pay | Admitting: Unknown Physician Specialty

## 2014-12-24 DIAGNOSIS — Z8546 Personal history of malignant neoplasm of prostate: Secondary | ICD-10-CM | POA: Insufficient documentation

## 2016-07-01 DIAGNOSIS — Z0000 Encounter for general adult medical examination without abnormal findings: Secondary | ICD-10-CM | POA: Insufficient documentation

## 2018-02-23 DIAGNOSIS — J431 Panlobular emphysema: Secondary | ICD-10-CM | POA: Insufficient documentation

## 2018-05-24 ENCOUNTER — Encounter: Payer: Self-pay | Admitting: *Deleted

## 2018-05-25 ENCOUNTER — Other Ambulatory Visit: Payer: Self-pay

## 2018-05-25 ENCOUNTER — Encounter: Payer: Self-pay | Admitting: *Deleted

## 2018-05-25 ENCOUNTER — Ambulatory Visit
Admission: RE | Admit: 2018-05-25 | Discharge: 2018-05-25 | Disposition: A | Discharge status: Home / Self Care | Payer: Medicare Other | Source: Ambulatory Visit | Attending: Unknown Physician Specialty | Admitting: Unknown Physician Specialty

## 2018-05-25 ENCOUNTER — Ambulatory Visit: Payer: Medicare Other | Admitting: Anesthesiology

## 2018-05-25 ENCOUNTER — Encounter
Admission: RE | Disposition: A | Discharge status: Home / Self Care | Payer: Self-pay | Source: Ambulatory Visit | Attending: Unknown Physician Specialty

## 2018-05-25 DIAGNOSIS — Z1211 Encounter for screening for malignant neoplasm of colon: Secondary | ICD-10-CM | POA: Diagnosis not present

## 2018-05-25 DIAGNOSIS — Z87891 Personal history of nicotine dependence: Secondary | ICD-10-CM | POA: Diagnosis not present

## 2018-05-25 DIAGNOSIS — Z86010 Personal history of colonic polyps: Secondary | ICD-10-CM | POA: Insufficient documentation

## 2018-05-25 DIAGNOSIS — Z8546 Personal history of malignant neoplasm of prostate: Secondary | ICD-10-CM | POA: Diagnosis not present

## 2018-05-25 DIAGNOSIS — K5730 Diverticulosis of large intestine without perforation or abscess without bleeding: Secondary | ICD-10-CM | POA: Insufficient documentation

## 2018-05-25 DIAGNOSIS — Z79899 Other long term (current) drug therapy: Secondary | ICD-10-CM | POA: Insufficient documentation

## 2018-05-25 DIAGNOSIS — K648 Other hemorrhoids: Secondary | ICD-10-CM | POA: Diagnosis not present

## 2018-05-25 HISTORY — PX: COLONOSCOPY WITH PROPOFOL: SHX5780

## 2018-05-25 SURGERY — COLONOSCOPY WITH PROPOFOL
Anesthesia: General

## 2018-05-25 MED ORDER — SODIUM CHLORIDE 0.9 % IV SOLN: INTRAVENOUS | Status: DC

## 2018-05-25 MED ORDER — FENTANYL CITRATE (PF) 100 MCG/2ML IJ SOLN
INTRAMUSCULAR | Status: DC | PRN
  Administered 2018-05-25 (×2): 50 ug via INTRAVENOUS

## 2018-05-25 MED ORDER — MIDAZOLAM HCL 5 MG/5ML IJ SOLN
INTRAMUSCULAR | Status: DC | PRN
  Administered 2018-05-25 (×2): 1 mg via INTRAVENOUS

## 2018-05-25 MED ORDER — MIDAZOLAM HCL 2 MG/2ML IJ SOLN
INTRAMUSCULAR | Status: AC
Start: 2018-05-25 — End: ?
  Filled 2018-05-25: qty 2

## 2018-05-25 MED ORDER — LIDOCAINE HCL (PF) 2 % IJ SOLN
INTRAMUSCULAR | Status: AC
  Filled 2018-05-25: qty 10

## 2018-05-25 MED ORDER — FENTANYL CITRATE (PF) 100 MCG/2ML IJ SOLN
INTRAMUSCULAR | Status: AC
  Filled 2018-05-25: qty 2

## 2018-05-25 MED ORDER — PROPOFOL 10 MG/ML IV BOLUS
INTRAVENOUS | Status: DC | PRN
  Administered 2018-05-25 (×2): 20 mg via INTRAVENOUS

## 2018-05-25 MED ORDER — LIDOCAINE HCL (PF) 2 % IJ SOLN
INTRAMUSCULAR | Status: DC | PRN
  Administered 2018-05-25: 80 mg

## 2018-05-25 MED ORDER — PROPOFOL 500 MG/50ML IV EMUL
INTRAVENOUS | Status: DC | PRN
  Administered 2018-05-25: 50 ug/kg/min via INTRAVENOUS

## 2018-05-25 MED ORDER — SODIUM CHLORIDE 0.9 % IV SOLN
INTRAVENOUS | Status: DC
  Administered 2018-05-25: 08:00:00 via INTRAVENOUS

## 2018-05-25 NOTE — Anesthesia Post-op Follow-up Note (Signed)
Anesthesia QCDR form completed.        

## 2018-05-25 NOTE — H&P (Signed)
Primary Care Physician:  Rusty Aus, MD Primary Gastroenterologist:  Dr. Vira Agar  Pre-Procedure History & Physical: HPI:  [name] is a 77 y.o. male is here for an colonoscopy.  Done for personal history of colon polyps.   Past Medical History:  Diagnosis Date  . Hypertrophy of prostate with urinary obstruction and other lower urinary tract symptoms (LUTS)   . Joint pain   . Nocturia    5-6x night  . Other and unspecified hyperlipidemia   . Prostate cancer ([place]) 05/01/12   gleason 3+4=7, vol 55 gms  . Radiation 10/02/2012 - 11/27/2012   40 fractions to prostate    Past Surgical History:  Procedure Laterality Date  . APPENDECTOMY     50 yrs ago  . CHOLECYSTECTOMY  1990's   lap choley  . ESOPHAGOSCOPY    . placement fiducial markers  08/23/12   3 gold seeds implanted in prostate/Dr.Grapey,David  . POLYPECTOMY     removed from throat  . PROSTATE BIOPSY  05/01/12   adenocarcinoma  . TONSILLECTOMY     with adenoidectomy 50 yrs ago    Prior to Admission medications   Medication Sig Start Date End Date Taking? Authorizing Provider  omeprazole (PRILOSEC) 40 MG capsule Take 40 mg by mouth daily.   Yes [provider]  simvastatin (ZOCOR) 20 MG tablet Take 20 mg by mouth daily.   Yes [provider]  polyethylene glycol-electrolytes (NULYTELY/GOLYTELY) 420 g solution Take 4,000 mLs by mouth once.    [provider]  silodosin (RAPAFLO) 8 MG CAPS capsule Take 8 mg by mouth daily with breakfast.    [provider]  Triptorelin Pamoate (TRELSTAR) 22.5 MG injection Inject 22.5 mg into the muscle every 6 (six) months. 08/23/12   Rana Snare, MD    Allergies as of 03/29/2018  . (No Known Allergies)    Family History  Problem Relation Age of Onset  . Cancer Father        pancreatic  . Diabetes Brother   . Heart disease Brother     Social History   Socioeconomic History  . Marital status: Married    Spouse name: Not on file   . Number of children: Not on file  . Years of education: Not on file  . Highest education level: Not on file  Occupational History  . Not on file  Social Needs  . Financial resource strain: Not on file  . Food insecurity:    Worry: Not on file    Inability: Not on file  . Transportation needs:    Medical: Not on file    Non-medical: Not on file  Tobacco Use  . Smoking status: Former Smoker    Packs/day: 2.00    Years: 25.00    Pack years: 50.00    Types: Cigarettes, Cigars    Last attempt to quit: 06/28/2008    Years since quitting: 9.9  . Smokeless tobacco: Never Used  . Tobacco comment: cigs until 44, cigars x 10 yrs  Substance and Sexual Activity  . Alcohol use: No  . Drug use: No  . Sexual activity: Not on file  Lifestyle  . Physical activity:    Days per week: Not on file    Minutes per session: Not on file  . Stress: Not on file  Relationships  . Social connections:    Talks on phone: Not on file    Gets together: Not on file    Attends religious service:  Not on file    Active member of club or organization: Not on file    Attends meetings of clubs or organizations: Not on file    Relationship status: Not on file  . Intimate partner violence:    Fear of current or ex partner: Not on file    Emotionally abused: Not on file    Physically abused: Not on file    Forced sexual activity: Not on file  Other Topics Concern  . Not on file  Social History Narrative  . Not on file    Review of Systems: See HPI, otherwise negative ROS  Physical Exam: BP (!) 142/86   Pulse 61   Temp (!) 97 F (36.1 C) (Tympanic)   Resp 18   Ht 5\' 10"  (1.778 m)   Wt 88.5 kg (195 lb)   SpO2 98%   BMI 27.98 kg/m  General:   Alert,  pleasant and cooperative in NAD Head:  Normocephalic and atraumatic. Neck:  Supple; no masses or thyromegaly. Lungs:  Clear throughout to auscultation.    Heart:  Regular rate and rhythm. Abdomen:  Soft, nontender and nondistended. Normal bowel  sounds, without guarding, and without rebound.   Neurologic:  Alert and  oriented x4;  grossly normal neurologically.  Impression/Plan: [name] is here for an colonoscopy to be performed for  Personal history of colon polyps.  Risks, benefits, limitations, and alternatives regarding  colonoscopy have been reviewed with the patient.  Questions have been answered.  All parties agreeable.   Gaylyn Cheers, MD  05/25/2018, 8:11 AM

## 2018-05-25 NOTE — Anesthesia Preprocedure Evaluation (Signed)
Anesthesia Evaluation  Patient identified by MRN, date of birth, ID band Patient awake    Reviewed: Allergy & Precautions, NPO status , Patient's Chart, lab work & pertinent test results, reviewed documented beta blocker date and time   Airway Mallampati: III  TM Distance: >3 FB     Dental  (+) Chipped   Pulmonary former smoker,           Cardiovascular      Neuro/Psych    GI/Hepatic   Endo/Other    Renal/GU      Musculoskeletal   Abdominal   Peds  Hematology   Anesthesia Other Findings   Reproductive/Obstetrics                             Anesthesia Physical Anesthesia Plan  ASA: II  Anesthesia Plan: General   Post-op Pain Management:    Induction: Intravenous  PONV Risk Score and Plan:   Airway Management Planned:   Additional Equipment:   Intra-op Plan:   Post-operative Plan:   Informed Consent: I have reviewed the patients History and Physical, chart, labs and discussed the procedure including the risks, benefits and alternatives for the proposed anesthesia with the patient or authorized representative who has indicated his/her understanding and acceptance.     Plan Discussed with: CRNA  Anesthesia Plan Comments:         Anesthesia Quick Evaluation

## 2018-05-25 NOTE — Op Note (Signed)
[facility] Patient Name: [name] Procedure Date: 05/25/2018 8:01 AM MRN: [id number] Account #: [id number] Date of Birth: 24-May-1941 Admit Type: Outpatient Age: 78 Room: [facility] ENDO ROOM 3 Gender: Male Note Status: Finalized Procedure:            Colonoscopy Indications:          High risk colon cancer surveillance: Personal history                        of colonic polyps Providers:            Manya Silvas, MD Referring MD:         Rusty Aus, MD (Referring MD) Medicines:            Propofol per Anesthesia Complications:        No immediate complications. Procedure:            Pre-Anesthesia Assessment:                       - After reviewing the risks and benefits, the patient                        was deemed in satisfactory condition to undergo the                        procedure.                       After obtaining informed consent, the colonoscope was                        passed under direct vision. Throughout the procedure,                        the patient's blood pressure, pulse, and oxygen                        saturations were monitored continuously. The                        Colonoscope was introduced through the anus and                        advanced to the the cecum, identified by appendiceal                        orifice and ileocecal valve. The colonoscopy was                        somewhat difficult due to a tortuous colon. Successful                        completion of the procedure was aided by applying                        abdominal pressure. The patient tolerated the procedure                        well. The quality of the bowel preparation was adequate  to identify polyps. Findings:      Multiple small and large-mouthed diverticula were found in the sigmoid       colon, descending colon and transverse colon.      Internal hemorrhoids were found during endoscopy. The  hemorrhoids were       small.      The exam was otherwise without abnormality. Impression:           - Diverticulosis in the sigmoid colon, in the                        descending colon and in the transverse colon.                       - Internal hemorrhoids.                       - The examination was otherwise normal.                       - No specimens collected. Recommendation:       - Repeat colonoscopy in 5 years for surveillance. Manya Silvas, MD 05/25/2018 8:39:22 AM This report has been signed electronically. Number of Addenda: 0 Note Initiated On: 05/25/2018 8:01 AM Scope Withdrawal Time: 0 hours 7 minutes 7 seconds  Total Procedure Duration: 0 hours 17 minutes 20 seconds       [facility]

## 2018-05-25 NOTE — Transfer of Care (Signed)
Immediate Anesthesia Transfer of Care Note  Patient: [name]  Procedure(s) Performed: COLONOSCOPY WITH PROPOFOL (N/A )  Patient Location: PACU  Anesthesia Type:General  Level of Consciousness: sedated  Airway & Oxygen Therapy: Patient Spontanous Breathing and Patient connected to nasal cannula oxygen  Post-op Assessment: Report given to RN and Post -op Vital signs reviewed and stable  Post vital signs: Reviewed and stable  Last Vitals:  Vitals Value Taken Time  BP    Temp    Pulse    Resp    SpO2      Last Pain:  Vitals:   05/25/18 0735  TempSrc: Tympanic  PainSc: 0-No pain         Complications: No apparent anesthesia complications

## 2018-05-25 NOTE — Anesthesia Postprocedure Evaluation (Signed)
Anesthesia Post Note  Patient: [name]  Procedure(s) Performed: COLONOSCOPY WITH PROPOFOL (N/A )  Patient location during evaluation: Endoscopy Anesthesia Type: General Level of consciousness: awake and alert Pain management: pain level controlled Vital Signs Assessment: post-procedure vital signs reviewed and stable Respiratory status: spontaneous breathing, nonlabored ventilation, respiratory function stable and patient connected to nasal cannula oxygen Cardiovascular status: blood pressure returned to baseline and stable Postop Assessment: no apparent nausea or vomiting Anesthetic complications: no     Last Vitals:  Vitals:   05/25/18 0848 05/25/18 0858  BP: 115/73 (!) 126/100  Pulse: (!) 54 (!) 51  Resp: 10 14  Temp:    SpO2: 98% 98%    Last Pain:  Vitals:   05/25/18 0838  TempSrc: Tympanic  PainSc: 0-No pain                 [name]

## 2018-05-28 ENCOUNTER — Encounter: Payer: Self-pay | Admitting: Unknown Physician Specialty

## 2018-08-24 DIAGNOSIS — E1169 Type 2 diabetes mellitus with other specified complication: Secondary | ICD-10-CM | POA: Insufficient documentation

## 2019-11-11 ENCOUNTER — Other Ambulatory Visit: Payer: Self-pay

## 2019-11-11 ENCOUNTER — Encounter: Payer: Self-pay | Admitting: Otolaryngology

## 2019-11-15 ENCOUNTER — Other Ambulatory Visit
Admission: RE | Admit: 2019-11-15 | Discharge: 2019-11-15 | Disposition: A | Discharge status: Home / Self Care | Payer: Medicare Other | Source: Ambulatory Visit | Attending: Otolaryngology | Admitting: Otolaryngology

## 2019-11-15 ENCOUNTER — Other Ambulatory Visit: Payer: Self-pay

## 2019-11-15 DIAGNOSIS — Z20822 Contact with and (suspected) exposure to covid-19: Secondary | ICD-10-CM | POA: Diagnosis not present

## 2019-11-15 DIAGNOSIS — Z01812 Encounter for preprocedural laboratory examination: Secondary | ICD-10-CM | POA: Insufficient documentation

## 2019-11-15 NOTE — Anesthesia Preprocedure Evaluation (Addendum)
Anesthesia Evaluation  Patient identified by MRN, date of birth, ID band Patient awake    Reviewed: Allergy & Precautions, NPO status , Patient's Chart, lab work & pertinent test results  History of Anesthesia Complications Negative for: history of anesthetic complications  Airway Mallampati: III  TM Distance: >3 FB Neck ROM: Full    Dental  (+) Upper Dentures, Lower Dentures   Pulmonary COPD, former smoker,    breath sounds clear to auscultation       Cardiovascular (-) angina(-) DOE  Rhythm:Regular Rate:Normal   HLD   Neuro/Psych    GI/Hepatic neg GERD  ,  Endo/Other  diabetes, Type 2  Renal/GU      Musculoskeletal   Abdominal   Peds  Hematology   Anesthesia Other Findings Prostate cancer  Reproductive/Obstetrics                            Anesthesia Physical Anesthesia Plan  ASA: II  Anesthesia Plan: General   Post-op Pain Management:    Induction: Intravenous  PONV Risk Score and Plan: 2 and Ondansetron and Treatment may vary due to age or medical condition  Airway Management Planned: Oral ETT  Additional Equipment:   Intra-op Plan:   Post-operative Plan: Extubation in OR  Informed Consent: I have reviewed the patients History and Physical, chart, labs and discussed the procedure including the risks, benefits and alternatives for the proposed anesthesia with the patient or authorized representative who has indicated his/her understanding and acceptance.       Plan Discussed with: CRNA and Anesthesiologist  Anesthesia Plan Comments:         Anesthesia Quick Evaluation

## 2019-11-16 LAB — SARS CORONAVIRUS 2 (TAT 6-24 HRS): SARS Coronavirus 2: NEGATIVE

## 2019-11-19 ENCOUNTER — Ambulatory Visit: Payer: Medicare Other | Admitting: Anesthesiology

## 2019-11-19 ENCOUNTER — Ambulatory Visit
Admission: RE | Admit: 2019-11-19 | Discharge: 2019-11-19 | Disposition: A | Discharge status: Home / Self Care | Payer: Medicare Other | Attending: Otolaryngology | Admitting: Otolaryngology

## 2019-11-19 ENCOUNTER — Encounter: Payer: Self-pay | Admitting: Otolaryngology

## 2019-11-19 ENCOUNTER — Other Ambulatory Visit: Payer: Self-pay

## 2019-11-19 ENCOUNTER — Encounter
Admission: RE | Disposition: A | Discharge status: Home / Self Care | Payer: Self-pay | Source: Home / Self Care | Attending: Otolaryngology

## 2019-11-19 DIAGNOSIS — J383 Other diseases of vocal cords: Secondary | ICD-10-CM | POA: Diagnosis not present

## 2019-11-19 DIAGNOSIS — E119 Type 2 diabetes mellitus without complications: Secondary | ICD-10-CM | POA: Diagnosis not present

## 2019-11-19 DIAGNOSIS — Z923 Personal history of irradiation: Secondary | ICD-10-CM | POA: Diagnosis not present

## 2019-11-19 DIAGNOSIS — Z79899 Other long term (current) drug therapy: Secondary | ICD-10-CM | POA: Insufficient documentation

## 2019-11-19 DIAGNOSIS — Z87891 Personal history of nicotine dependence: Secondary | ICD-10-CM | POA: Diagnosis not present

## 2019-11-19 DIAGNOSIS — Z8546 Personal history of malignant neoplasm of prostate: Secondary | ICD-10-CM | POA: Diagnosis not present

## 2019-11-19 DIAGNOSIS — J449 Chronic obstructive pulmonary disease, unspecified: Secondary | ICD-10-CM | POA: Diagnosis not present

## 2019-11-19 DIAGNOSIS — E785 Hyperlipidemia, unspecified: Secondary | ICD-10-CM | POA: Insufficient documentation

## 2019-11-19 DIAGNOSIS — J387 Other diseases of larynx: Secondary | ICD-10-CM | POA: Diagnosis present

## 2019-11-19 HISTORY — DX: Presence of dental prosthetic device (complete) (partial): Z97.2

## 2019-11-19 HISTORY — PX: DIRECT LARYNGOSCOPY: SHX5326

## 2019-11-19 SURGERY — LARYNGOSCOPY, DIRECT
Anesthesia: General | Site: Throat

## 2019-11-19 MED ORDER — GLYCOPYRROLATE 0.2 MG/ML IJ SOLN
INTRAMUSCULAR | Status: DC | PRN
  Administered 2019-11-19: .1 mg via INTRAVENOUS

## 2019-11-19 MED ORDER — PROMETHAZINE HCL 25 MG/ML IJ SOLN: 6.2500 mg | INTRAMUSCULAR | Status: DC | PRN

## 2019-11-19 MED ORDER — OXYCODONE HCL 5 MG PO TABS: 5.0000 mg | ORAL_TABLET | Freq: Once | ORAL | Status: AC | PRN

## 2019-11-19 MED ORDER — OXYMETAZOLINE HCL 0.05 % NA SOLN
NASAL | Status: DC | PRN
  Administered 2019-11-19: 1 via TOPICAL

## 2019-11-19 MED ORDER — MIDAZOLAM HCL 5 MG/5ML IJ SOLN
INTRAMUSCULAR | Status: DC | PRN
  Administered 2019-11-19: 1 mg via INTRAVENOUS

## 2019-11-19 MED ORDER — OXYCODONE HCL 5 MG/5ML PO SOLN
5.0000 mg | Freq: Once | ORAL | Status: AC | PRN
  Administered 2019-11-19: 5 mg via ORAL

## 2019-11-19 MED ORDER — SUCCINYLCHOLINE CHLORIDE 20 MG/ML IJ SOLN
INTRAMUSCULAR | Status: DC | PRN
  Administered 2019-11-19: 100 mg via INTRAVENOUS

## 2019-11-19 MED ORDER — ONDANSETRON HCL 4 MG/2ML IJ SOLN
INTRAMUSCULAR | Status: DC | PRN
  Administered 2019-11-19: 4 mg via INTRAVENOUS

## 2019-11-19 MED ORDER — ROCURONIUM BROMIDE 100 MG/10ML IV SOLN: INTRAVENOUS | Status: DC | PRN

## 2019-11-19 MED ORDER — FENTANYL CITRATE (PF) 100 MCG/2ML IJ SOLN
INTRAMUSCULAR | Status: DC | PRN
  Administered 2019-11-19 (×2): 50 ug via INTRAVENOUS

## 2019-11-19 MED ORDER — DEXAMETHASONE SODIUM PHOSPHATE 4 MG/ML IJ SOLN
INTRAMUSCULAR | Status: DC | PRN
  Administered 2019-11-19: 4 mg via INTRAVENOUS

## 2019-11-19 MED ORDER — PROPOFOL 10 MG/ML IV BOLUS
INTRAVENOUS | Status: DC | PRN
  Administered 2019-11-19: 160 mg via INTRAVENOUS
  Administered 2019-11-19: 40 mg via INTRAVENOUS

## 2019-11-19 MED ORDER — HYDROCODONE-ACETAMINOPHEN 7.5-325 MG/15ML PO SOLN: ORAL | 0 refills | Status: DC

## 2019-11-19 MED ORDER — LIDOCAINE HCL 4 % EX SOLN
CUTANEOUS | Status: DC | PRN
  Administered 2019-11-19: 140 mL via TOPICAL

## 2019-11-19 MED ORDER — HYDROMORPHONE HCL 1 MG/ML IJ SOLN: 0.2500 mg | INTRAMUSCULAR | Status: DC | PRN

## 2019-11-19 MED ORDER — LACTATED RINGERS IV SOLN
100.0000 mL/h | INTRAVENOUS | Status: DC
  Administered 2019-11-19: 100 mL/h via INTRAVENOUS

## 2019-11-19 SURGICAL SUPPLY — 15 items
BLOCK BITE GUARD (MISCELLANEOUS) ×2 IMPLANT
COVER MAYO STAND STRL (DRAPES) ×2 IMPLANT
COVER TABLE BACK 60X90 (DRAPES) ×2 IMPLANT
CUP MEDICINE 2OZ PLAST GRAD ST (MISCELLANEOUS) ×2 IMPLANT
DRSG TELFA 4X3 1S NADH ST (GAUZE/BANDAGES/DRESSINGS) ×2 IMPLANT
GLOVE BIO SURGEON STRL SZ7.5 (GLOVE) ×2 IMPLANT
KIT TURNOVER KIT A (KITS) ×2 IMPLANT
MARKER SKIN DUAL TIP RULER LAB (MISCELLANEOUS) ×1 IMPLANT
NDL 18GX1X1/2 (RX/OR ONLY) (NEEDLE) IMPLANT
NEEDLE 18GX1X1/2 (RX/OR ONLY) (NEEDLE) ×2 IMPLANT
PATTIES SURGICAL .5 X.5 (GAUZE/BANDAGES/DRESSINGS) ×2 IMPLANT
SPONGE XRAY 4X4 16PLY STRL (MISCELLANEOUS) ×2 IMPLANT
TOWEL OR 17X26 4PK STRL BLUE (TOWEL DISPOSABLE) ×2 IMPLANT
TUBING CONN 6MMX3.1M (TUBING) ×1
TUBING SUCTION CONN 0.25 STRL (TUBING) ×1 IMPLANT

## 2019-11-19 NOTE — Discharge Instructions (Signed)
Laryngoscopy, Care After This sheet gives you information about how to care for yourself after your procedure. Your health care provider may also give you more specific instructions. If you have problems or questions, contact your health care provider. What can I expect after the procedure? After the procedure, it is common to have:  A sore throat.  A hoarse voice.  A temporary change in how your voice sounds. Follow these instructions at home: Medicines  Take over-the-counter and prescription medicines only as told by your health care provider. Driving   Do not drive for 24 hours if you were given a sedative during your procedure. General instructions   Return to your normal activities as told by your health care provider. Ask your health care provider what activities are safe for you. ? If your laryngoscopy was done using medicine to numb only your throat (local anesthetic), you may be able to return to your normal activities right away.  If your health care provider took tissue from your throat for lab testing (biopsy), it is up to you to get your test results. Ask your health care provider, or the department that did the procedure, when your results will be ready.  Do not use any products that contain nicotine or tobacco, such as cigarettes and e-cigarettes. If you need help quitting, ask your health care provider.  Follow instructions from your health care provider about eating or drinking restrictions.  Keep all follow-up visits as told by your health care provider. This is important. Contact a health care provider if:  You have a fever.  You develop a cough.  You develop nausea and vomiting. Get help right away if:  You have severe pain.  You have chest pain.  You have new bleeding during coughing, spitting, or vomiting.  You develop new problems with swallowing.  You have difficulty breathing. Summary  After a laryngoscopy it is common to have a sore throat,  a hoarse voice, or a temporary change in the sound of your voice.  Take over the counter and prescription medicines as told by your health care provider.  Get help right away if you have difficulty breathing after this procedure.  Keep all follow-up visits as told by your health care provider. This is important. This information is not intended to replace advice given to you by your health care provider. Make sure you discuss any questions you have with your health care provider. Document Revised: 02/14/2018 Document Reviewed: 02/14/2018 Elsevier Patient Education  2020 Else[employer]   General Anesthesia, Adult, Care After This sheet gives you information about how to care for yourself after your procedure. Your health care provider may also give you more specific instructions. If you have problems or questions, contact your health care provider. What can I expect after the procedure? After the procedure, the following side effects are common:  Pain or discomfort at the IV site.  Nausea.  Vomiting.  Sore throat.  Trouble concentrating.  Feeling cold or chills.  Weak or tired.  Sleepiness and fatigue.  Soreness and body aches. These side effects can affect parts of the body that were not involved in surgery. Follow these instructions at home:  For at least 24 hours after the procedure:  Have a responsible adult stay with you. It is important to have someone help care for you until you are awake and alert.  Rest as needed.  Do not: ? Participate in activities in which you could fall or become injured. ? Drive. ?   Use heavy machinery. ? Drink alcohol. ? Take sleeping pills or medicines that cause drowsiness. ? Make important decisions or sign legal documents. ? Take care of children on your own. Eating and drinking  Follow any instructions from your health care provider about eating or drinking restrictions.  When you feel hungry, start by eating small amounts of  foods that are soft and easy to digest (bland), such as toast. Gradually return to your regular diet.  Drink enough fluid to keep your urine pale yellow.  If you vomit, rehydrate by drinking water, juice, or clear broth. General instructions  If you have sleep apnea, surgery and certain medicines can increase your risk for breathing problems. Follow instructions from your health care provider about wearing your sleep device: ? Anytime you are sleeping, including during daytime naps. ? While taking prescription pain medicines, sleeping medicines, or medicines that make you drowsy.  Return to your normal activities as told by your health care provider. Ask your health care provider what activities are safe for you.  Take over-the-counter and prescription medicines only as told by your health care provider.  If you smoke, do not smoke without supervision.  Keep all follow-up visits as told by your health care provider. This is important. Contact a health care provider if:  You have nausea or vomiting that does not get better with medicine.  You cannot eat or drink without vomiting.  You have pain that does not get better with medicine.  You are unable to pass urine.  You develop a skin rash.  You have a fever.  You have redness around your IV site that gets worse. Get help right away if:  You have difficulty breathing.  You have chest pain.  You have blood in your urine or stool, or you vomit blood. Summary  After the procedure, it is common to have a sore throat or nausea. It is also common to feel tired.  Have a responsible adult stay with you for the first 24 hours after general anesthesia. It is important to have someone help care for you until you are awake and alert.  When you feel hungry, start by eating small amounts of foods that are soft and easy to digest (bland), such as toast. Gradually return to your regular diet.  Drink enough fluid to keep your urine pale  yellow.  Return to your normal activities as told by your health care provider. Ask your health care provider what activities are safe for you. This information is not intended to replace advice given to you by your health care provider. Make sure you discuss any questions you have with your health care provider. Document Revised: 10/20/2017 Document Reviewed: 06/02/2017 Elsevier Patient Education  2020 Elsevier Inc.  

## 2019-11-19 NOTE — Op Note (Signed)
11/19/2019  9:51 AM    [name], [name]  [id number]    Pre-Op Diagnosis:  Hoarseness, laryngeal lesion  Post-op Diagnosis: Same  Procedure:  Microaryngoscopy with Biopsies  Surgeon:  Riley Nearing., MD  Anesthesia:  General Endotracheal  EBL:  minimal  Complications:  None  Findings:  Friable lesion of left TVC extending to subglottis maybe 60mm inferiorly. 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 Separate biopsies taken of right and left cords.  Procedure: With the patient in a comfortable supine position, general endotracheal anesthesia was induced without difficulty.  At an appropriate level, the table was turned 90 degrees away from Anesthesia.  A clean preparation and draping was performed in the standard fashion.   A tooth guard was placed to protect the gingiva. Using the Dedo laryngoscope, the oropharynx, hypopharynx and larynx were carefully inspected. With the scope in the endolarynx, the patient was placed into suspension.   Biopsies were taken from the left cord and subglottis. A separate biopsy was taken of the right anterior true vocal cord. Bleeding was controlled with Afrin moistened pledgets.  The findings were as described above.  The laryngoscope was removed.  The neck was palpated on both sides with the findings as described above.  At this point the procedure was completed.  Dental status was intact.  The patient was returned to Anesthesia, awakened, extubated, and transferred to PACU in satisfactory condition.   Disposition: To PACU, then discharge home  Plan: Soft, bland diet, advance as tolerated. Take pain medications as prescribed. Follow-up in 3 weeks.  Riley Nearing 11/19/2019 9:51 AM

## 2019-11-19 NOTE — Anesthesia Postprocedure Evaluation (Signed)
Anesthesia Post Note  Patient: [name]  Procedure(s) Performed: DIRECT LARYNGOSCOPY (N/A Throat)     Patient location during evaluation: PACU Anesthesia Type: General Level of consciousness: awake and alert Pain management: pain level controlled Vital Signs Assessment: post-procedure vital signs reviewed and stable Respiratory status: spontaneous breathing, nonlabored ventilation, respiratory function stable and patient connected to nasal cannula oxygen Cardiovascular status: blood pressure returned to baseline and stable Postop Assessment: no apparent nausea or vomiting Anesthetic complications: no    Ramiro Pangilinan A  Json Koelzer

## 2019-11-19 NOTE — Anesthesia Procedure Notes (Addendum)
Procedure Name: Intubation Date/Time: 11/19/2019 9:22 AM Performed by: Cameron Ali, CRNA Pre-anesthesia Checklist: Patient identified, Emergency Drugs available, Suction available, Patient being monitored and Timeout performed Patient Re-evaluated:Patient Re-evaluated prior to induction Oxygen Delivery Method: Circle system utilized Preoxygenation: Pre-oxygenation with 100% oxygen Induction Type: IV induction Ventilation: Mask ventilation without difficulty Laryngoscope Size: Mac and 3 Grade View: Grade II Tube type: MLT Tube size: 6.0 mm Number of attempts: 1 Airway Equipment and Method: LTA kit utilized Placement Confirmation: ETT inserted through vocal cords under direct vision,  positive ETCO2 and breath sounds checked- equal and bilateral Secured at: 21 cm Tube secured with: Tape Dental Injury: Teeth and Oropharynx as per pre-operative assessment

## 2019-11-19 NOTE — H&P (Signed)
History and physical reviewed and will be scanned in later. No change in medical status reported by the patient or family, appears stable for surgery. All questions regarding the procedure answered, and patient (or family if a child) expressed understanding of the procedure. ? ?[name] S [name] ?@TODAY@ ?

## 2019-11-19 NOTE — Transfer of Care (Signed)
Immediate Anesthesia Transfer of Care Note  Patient: [name]  Procedure(s) Performed: DIRECT LARYNGOSCOPY (N/A Throat)  Patient Location: PACU  Anesthesia Type: General  Level of Consciousness: awake, alert  and patient cooperative  Airway and Oxygen Therapy: Patient Spontanous Breathing and Patient connected to supplemental oxygen  Post-op Assessment: Post-op Vital signs reviewed, Patient's Cardiovascular Status Stable, Respiratory Function Stable, Patent Airway and No signs of Nausea or vomiting  Post-op Vital Signs: Reviewed and stable  Complications: No apparent anesthesia complications

## 2019-11-20 ENCOUNTER — Encounter: Payer: Self-pay | Admitting: *Deleted

## 2019-11-21 LAB — SURGICAL PATHOLOGY

## 2019-11-29 ENCOUNTER — Other Ambulatory Visit: Payer: Self-pay

## 2019-11-29 ENCOUNTER — Telehealth: Payer: Self-pay

## 2019-11-29 ENCOUNTER — Encounter: Payer: Self-pay | Admitting: Hematology and Oncology

## 2019-11-29 NOTE — Telephone Encounter (Signed)
Spoke with the patient to do his assesment for Monday appointment. I have informed the patient of the facility location and what provider he will see Dr Mike Gip. I am Loma Sousa, [place] and Celsea RN will be in the clinic on Monday. The patient is aware of his dx from prior provider. The patient expressed his understanding and was agreeable to keep his current appointnemt.

## 2019-11-29 NOTE — Progress Notes (Signed)
The patient name and DOB has been verified by phone today. 

## 2019-12-01 NOTE — Progress Notes (Signed)
[facility]  [address] [place], [place] [address] Phone: [phone]  Fax: [phone]   Clinic Day:  12/02/2019  Referring physician: Clyde Canterbury, MD  Chief Complaint: [name] is a 79 y.o. male with a laryngeal lesion who is referred in consultation by Dr Clyde Canterbury for assessment and management.   HPI:   The patient has a history of smoking.  He smoked 1 1/2 - 2 packs/day x 25 years.  He stopped smoking on 08/28/2009.  He describes vocal cord polyps in 2009 or 2010 which were removed.  Pathology was benign (no report available).  He describes intermittent hoarseness for about a year.  He presented with a 3 month history of worsening hoarseness on 11/11/2019.  Hoarseness was described as gravelly and moderate in severity.  Hoarseness was associated with chest congestion but no difficulty swallowing, dry mouth, change in taste, pain on swallowing, ear pain or reflux.   Video laryngoscopy revealed moderate posterior cobblestoning.  There was a lesion of the left cord that extended into the subglottic with dysplastic appearance.  Vocal cord mobility was normal.  Plan was for micro-direct laryngoscopy and biopsy.  Microlaryngoscopy on 11/19/2019 by Dr. Clyde Canterbury revealed a friable lesion of the left true vocal cord (TVC) extending to the subglottis possibly 8 mm inferiorly.  The right cord had a dysplastic appearance of the anterior third of the cord.  Biopsies were taken of the left and right cords as well as the subglottic region.  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  There was adjacent granulation tissue with surface ulceration.  A more clinically significant underlying lesion could not be excluded.  Right vocal cord biopsy revealed ulcerated squamous mucosa with high-grade dysplasia at least.  A more clinically significant underlying lesion could not be excluded.  Symptomatically, he  feels great.  He denies any problems.  He has a history of prostate cancer.  He presented with an elevated PSA (5.66).  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 He received radiation at [facility].    Past Medical History:  Diagnosis Date  . Hypertrophy of prostate with urinary obstruction and other lower urinary tract symptoms (LUTS)   . Joint pain   . Nocturia    5-6x night  . Other and unspecified hyperlipidemia   . Prostate cancer ([place]) 05/01/12   gleason 3+4=7, vol 55 gms  . Radiation 10/02/2012 - 11/27/2012   40 fractions to prostate  . Wears dentures    full upper and lower    Past Surgical History:  Procedure Laterality Date  . APPENDECTOMY     50 yrs ago  . CHOLECYSTECTOMY  1990's   lap choley  . COLONOSCOPY WITH PROPOFOL N/A 05/25/2018   Procedure: COLONOSCOPY WITH PROPOFOL;  Surgeon: Manya Silvas, MD;  Location: [facility] ENDOSCOPY;  Service: Endoscopy;  Laterality: N/A;  . DIRECT LARYNGOSCOPY N/A 11/19/2019   Procedure: DIRECT LARYNGOSCOPY;  Surgeon: Clyde Canterbury, MD;  Location: [place];  Service: ENT;  Laterality: N/A;  . ESOPHAGOSCOPY    . placement fiducial markers  08/23/12   3 gold seeds implanted in prostate/Dr.Grapey,David  . POLYPECTOMY     removed from throat  . PROSTATE BIOPSY  05/01/12   adenocarcinoma  . TONSILLECTOMY     with adenoidectomy 46 yrs ago    Family History  Problem Relation Age of Onset  . Cancer Father  pancreatic  . Diabetes Brother   . Heart disease Brother     Social History:  reports that he quit smoking about 11 years ago. His smoking use included cigarettes and cigars. He has a 50.00 pack-year smoking history. He has never used smokeless tobacco. He reports that he does not drink alcohol or use drugs. He smoked 1 1/2 to 2 ppd for 25 years. He quit smoking in 2009/2010. 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  He has since retired. The patient is accompanied by his wife [name] via Ipad today.  Allergies: No Known Allergies  Current Medications: Current Outpatient Medications  Medication Sig Dispense Refill  . simvastatin (ZOCOR) 20 MG tablet Take 20 mg by mouth daily.    Marland Kitchen HYDROcodone-acetaminophen (HYCET) 7.5-325 mg/15 ml solution 10-15cc PO every 6 hours prn pain (Patient not taking: Reported on 11/29/2019) 200 mL 0   No current facility-administered medications for this visit.    Review of Systems  Constitutional: Negative for chills, diaphoresis, fever, malaise/fatigue and weight loss.       Feels "great".  HENT: Negative for congestion, ear pain, hearing loss, nosebleeds, sinus pain, sore throat and tinnitus.        Hoarse.  Rhinorrhea mainly in the morning x 3-4 years.  Eyes: Negative.  Negative for blurred vision and double vision.  Respiratory: Negative.  Negative for cough, sputum production, shortness of breath and wheezing.   Cardiovascular: Negative.  Negative for chest pain, palpitations and leg swelling.  Gastrointestinal: Negative.  Negative for abdominal pain, blood in stool, constipation, diarrhea, heartburn, melena, nausea and vomiting.  Genitourinary: Negative.  Negative for dysuria, frequency, hematuria and urgency.  Musculoskeletal: Negative.  Negative for back pain, joint pain, myalgias and neck pain.  Skin: Negative.   Neurological: Negative.  Negative for dizziness, tingling, sensory change, focal weakness, weakness and headaches.  Endo/Heme/Allergies: Negative.  Does not bruise/bleed easily.  Psychiatric/Behavioral: Negative.  Negative for depression and memory loss. The patient is not nervous/anxious and does not have insomnia.    Performance status (ECOG):  0  Vitals Blood pressure (!) 141/75, pulse 73, temperature 98.8 F (37.1 C), temperature source Tympanic, resp. rate 18, height 5\' 10"  (1.778 m), weight 211 lb (95.7 kg), SpO2 97 %.    Physical Exam  Constitutional: He is oriented to person, place, and time. He appears well-developed and well-nourished. No distress. Face mask in place.  HENT:  Head: Normocephalic and atraumatic.  Mouth/Throat: Oropharynx is clear and moist. No oral lesions. Normal dentition. Lacerations (bitten cheek, healing) present. No dental abscesses, uvula swelling or dental caries. No oropharyngeal exudate.  Gray hair.  Dentures.  Mask.  Eyes: Pupils are equal, round, and reactive to light. Conjunctivae and EOM are normal.  Glasses.  Light brown/hazel eyes.  Neck: No JVD present.  Cardiovascular: Normal rate, regular rhythm and normal heart sounds. Exam reveals no gallop.  No murmur heard. Pulmonary/Chest: Effort normal and breath sounds normal. No respiratory distress. He has no wheezes. He has no rales.  Abdominal: Soft. Bowel sounds are normal. He exhibits no distension. There is no abdominal tenderness. There is no rebound and no guarding.  Musculoskeletal:        General: No tenderness or edema. Normal range of motion.     Cervical back: Normal range of motion and neck supple.  Lymphadenopathy:       Head (right side): No preauricular, no posterior  auricular and no occipital adenopathy present.       Head (left side): No preauricular, no posterior auricular and no occipital adenopathy present.    He has no cervical adenopathy.    He has no axillary adenopathy.       Right: No inguinal and no supraclavicular adenopathy present.       Left: No inguinal and no supraclavicular adenopathy present.  Neurological: He is alert and oriented to person, place, and time.  Skin: Skin is warm and dry. No rash noted. He is not diaphoretic. No erythema. No pallor.  Psychiatric: He has a normal mood and affect. His behavior is normal. Judgment and thought content normal.  Nursing note and vitals reviewed.   No visits with results within 3 Day(s) from this visit.  Latest known visit with results is:   Admission on 11/19/2019, Discharged on 11/19/2019  Component Date Value Ref Range Status  . SURGICAL PATHOLOGY 11/19/2019    Final-Edited                   Value:SURGICAL PATHOLOGY CASE: [id number] PATIENT: [name] Surgical Pathology Report  Specimen Submitted: A. Vocal cord, left; bx B. Vocal cord, right; bx  Clinical History: Hoarseness  DIAGNOSIS: A. VOCAL CORD, LEFT; BIOPSY: - SQUAMOUS MUCOSA WITH HIGH GRADE DYSPLASIA AT LEAST; SEE COMMENT. - ADJACENT GRANULATION TISSUE WITH SURFACE ULCERATION.  Comment: There are focal areas concerning for superficial invasion; however, definitive evidence of invasive carcinoma is not identified.  The biopsies are superficial, and a more clinically significant underlying lesion cannot be excluded.  Clinical correlation is recommended.  B. VOCAL CORD, RIGHT; BIOPSY: - ULCERATED SQUAMOUS MUCOSA WITH HIGH GRADE DYSPLASIA AT LEAST; SEE COMMENT.  Comment: The biopsy is superficial, and a more clinically significant underlying lesion cannot be excluded.  GROSS DESCRIPTION: A. Labeled: Left vocal cord lesion Received: In formalin Tissue fragment(s): 2 Size                         : 0.3 and 0.4 cm Description: Tan soft tissue fragments, entirely submitted in 1 cassette.  B. Labeled: Right vocal cord lesion Received: In formalin Tissue fragment(s): 1 Size: 0.2 cm Description: Tan soft tissue fragment, entirely submitted in 1 cassette.  Final Diagnosis performed by Betsy Pries, MD.   Electronically signed 11/21/2019 2:00:13PM The electronic signature indicates that the named Attending Pathologist has evaluated the specimen Technical component performed at [facility], [address], [place], [place] [address] Lab: [phone] Dir: Rush Farmer, MD, MMM  Professional component performed at [facility], [facility], [place], [place], [place] [address] Lab: [phone] Dir: Dellia Nims. Rubinas, MD     Assessment:  TION BERHE is a 79 y.o. 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  Vocal cord mobility is normal.   There was a friable lesion of the left true vocal cord (TVC) extending to the subglottis possibly 8 mm inferiorly.  The right cord had a dysplastic appearance of the anterior third of the cord.  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  There was adjacent granulation tissue with surface ulceration.  A more clinically significant underlying lesion could not be excluded.  Right vocal cord biopsy revealed ulcerated squamous mucosa with high-grade dysplasia at least.  A more clinically significant underlying lesion could not be excluded.  He has a history of prostate cancer after presenting  with an elevated PSA (5.66).  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 He received radiation at [facility].   Symptomatically, he feels "great".  He has a history of intermittent hoarseness for 1 year that has progressed over the past 3 months.  Exam is unremarkable.  Plan: 1.   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  Review pathology.  No evidence of invasive cancer although an underlying lesion cannot be excluded.  Discuss with Dr Richardson Landry and Dr Baruch Gouty.  Suspect patient will receive radiation.  Discuss consideration of imaging (MRI or CT) to ensure no evidence of more advanced disease. 2.   Present at tumor board on 12/12/2019.  3.   MD to call patient after tumor board. 4.   RTC prn.   Addendum:  The patient was contacted regarding tumor board discussions.  Patient was felt to benefit from CT scan of head and neck prior to definitive radiation.  I discussed the assessment and treatment plan with the patient.  The patient was provided an opportunity to ask questions and  all were answered.  The patient agreed with the plan and demonstrated an understanding of the instructions.  The patient was advised to call back if the symptoms worsen or if the condition fails to improve as anticipated.  I provided 25 minutes (2:45 PM - 3:10 PM) of face-to-face time during this this encounter and > 50% was spent counseling as documented under my assessment and plan. Additional time was spent reviewing records and discussion with Dr Richardson Landry and Dr Baruch Gouty.  I discussed the assessment and treatment plan with the patient.    Kaylen Motl C. Mike Gip, MD, PhD    12/02/2019, 3:10 PM  I, Samul Dada, am acting as scribe for [employer]. Mike Gip, MD, PhD.  I, Gorge Almanza C. Mike Gip, MD, have reviewed the above documentation for accuracy and completeness, and I agree with the above.

## 2019-12-02 ENCOUNTER — Other Ambulatory Visit: Payer: Self-pay

## 2019-12-02 ENCOUNTER — Inpatient Hospital Stay: Payer: Medicare Other | Attending: Hematology and Oncology | Admitting: Hematology and Oncology

## 2019-12-02 VITALS — BP 141/75 | HR 73 | Temp 98.8°F | Resp 18 | Ht 70.0 in | Wt 211.0 lb

## 2019-12-02 DIAGNOSIS — R490 Dysphonia: Secondary | ICD-10-CM | POA: Diagnosis not present

## 2019-12-02 DIAGNOSIS — Z79899 Other long term (current) drug therapy: Secondary | ICD-10-CM | POA: Diagnosis not present

## 2019-12-02 DIAGNOSIS — Z87891 Personal history of nicotine dependence: Secondary | ICD-10-CM | POA: Insufficient documentation

## 2019-12-02 DIAGNOSIS — J383 Other diseases of vocal cords: Secondary | ICD-10-CM | POA: Diagnosis present

## 2019-12-02 DIAGNOSIS — Z8546 Personal history of malignant neoplasm of prostate: Secondary | ICD-10-CM | POA: Diagnosis not present

## 2019-12-02 DIAGNOSIS — Z923 Personal history of irradiation: Secondary | ICD-10-CM | POA: Diagnosis not present

## 2019-12-10 ENCOUNTER — Telehealth: Payer: Self-pay

## 2019-12-10 ENCOUNTER — Other Ambulatory Visit: Payer: Self-pay | Admitting: Hematology and Oncology

## 2019-12-10 DIAGNOSIS — J383 Other diseases of vocal cords: Secondary | ICD-10-CM

## 2019-12-10 NOTE — Telephone Encounter (Signed)
Spoke with the patient to inform him, per Dr Mike Gip she has spoke with Dr Donella Stade and that he was not added to tumor board last week that he will be added for 12/12/2019. Per Dr Mike Gip she will give the patient a call after tumor board. The patient was understanding and agreeable.

## 2019-12-10 NOTE — Telephone Encounter (Signed)
-----   Message from Lequita Asal, MD sent at 12/10/2019 12:42 PM EST ----- Regarding: Please call patient Contact: [phone]  Patient was not on tumor board last week.  I spoke with Dr Baruch Gouty.  He should be on tumor board this week (12/12/2019).  I will call after tumor board.  M ----- Message ----- From: Secundino Ginger Sent: 12/10/2019   9:13 AM EST To: Lequita Asal, MD  What is status of the tumor board meeting. His wife said that was last Thursday and they haven't heard anything. Call Enid Derry (his Wife)

## 2019-12-12 ENCOUNTER — Other Ambulatory Visit: Payer: Medicare Other

## 2019-12-12 NOTE — Progress Notes (Signed)
Tumor Board Documentation  Dennard Airey Meegan was presented by Dr Mike Gip at our Tumor Board on 12/12/2019, which included representatives from medical oncology, radiation oncology, navigation, pathology, radiology, surgical, surgical oncology, internal medicine, pharmacy, genetics, palliative care, research.  [name] currently presents as a new patient, for [place], for new positive pathology with history of the following treatments: surgical intervention(s), active survellience.  Additionally, we reviewed previous medical and familial history, history of present illness, and recent lab results along with all available histopathologic and imaging studies. The tumor board considered available treatment options and made the following recommendations: Radiation therapy (primary modality), Additional screening CT Scan of Head and Neck  The following procedures/referrals were also placed: No orders of the defined types were placed in this encounter.   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  Tumor board is a meeting of clinicians from various specialty areas who evaluate and discuss patients for whom a multidisciplinary approach is being considered. Final determinations in the plan of care are those of the provider(s). The responsibility for follow up of recommendations given during tumor board is that of the provider.   Today's extended care, comprehensive team conference, Aldrick was not present for the discussion and was not examined.   Multidisciplinary Tumor Board is a multidisciplinary case peer review process.  Decisions discussed in the Multidisciplinary Tumor Board reflect the opinions of the specialists present at the conference without having examined the patient.  Ultimately, treatment and diagnostic decisions rest  with the primary provider(s) and the patient.

## 2019-12-25 ENCOUNTER — Ambulatory Visit
Admission: RE | Admit: 2019-12-25 | Discharge: 2019-12-25 | Disposition: A | Discharge status: Home / Self Care | Payer: Medicare Other | Source: Ambulatory Visit | Attending: Radiation Oncology | Admitting: Radiation Oncology

## 2019-12-25 ENCOUNTER — Other Ambulatory Visit: Payer: Self-pay

## 2019-12-25 ENCOUNTER — Encounter: Payer: Self-pay | Admitting: Radiation Oncology

## 2019-12-25 ENCOUNTER — Other Ambulatory Visit: Payer: Self-pay | Admitting: *Deleted

## 2019-12-25 VITALS — BP 144/84 | HR 78 | Temp 97.6°F | Resp 20 | Wt 210.6 lb

## 2019-12-25 DIAGNOSIS — Z79899 Other long term (current) drug therapy: Secondary | ICD-10-CM | POA: Insufficient documentation

## 2019-12-25 DIAGNOSIS — R490 Dysphonia: Secondary | ICD-10-CM | POA: Diagnosis not present

## 2019-12-25 DIAGNOSIS — M2550 Pain in unspecified joint: Secondary | ICD-10-CM | POA: Diagnosis not present

## 2019-12-25 DIAGNOSIS — Z923 Personal history of irradiation: Secondary | ICD-10-CM | POA: Diagnosis not present

## 2019-12-25 DIAGNOSIS — F17210 Nicotine dependence, cigarettes, uncomplicated: Secondary | ICD-10-CM | POA: Diagnosis not present

## 2019-12-25 DIAGNOSIS — J383 Other diseases of vocal cords: Secondary | ICD-10-CM | POA: Insufficient documentation

## 2019-12-25 DIAGNOSIS — Z8546 Personal history of malignant neoplasm of prostate: Secondary | ICD-10-CM | POA: Insufficient documentation

## 2019-12-25 NOTE — Consult Note (Signed)
NEW PATIENT EVALUATION  Name: [name]  MRN: [id number]  Date:   12/25/2019     DOB: 1941-03-25   This 79 y.o. male patient presents to the clinic for initial evaluation of high-grade dysplasia cannot rule out invasive squamous cell carcinoma of the larynx.  REFERRING PHYSICIAN: Rusty Aus, MD  CHIEF COMPLAINT:  Chief Complaint  Patient presents with  . vocal cord cancer    DIAGNOSIS: The encounter diagnosis was Lesion of true vocal cord.   PREVIOUS INVESTIGATIONS:  CT scan of the head and neck ordered Pathology report reviewed Clinical notes reviewed  HPI: Patient is a 79 year old male who has a approximately 40-pack-year smoking history stopped smoking 2010.  He had a vocal cord polyp removed in 2009 with benign pathology.  He has developed intermittent hoarseness over the past year.  He was seen by Dr. Richardson Landry underwent laryngoscopy showing a left cord lesion extended into the subglottic space with a dysplastic appearance.  Mobility of cords was normal.  Biopsy of the left cord showed squamous mucosa with high-grade dysplasia at least.  There were focal areas of concern for superficial invasion although definitive evidence of invasion was not identified the biopsies though were superficial.  Right cord also had ulcerated squamous mucosa with high-grade dysplasia at least although again these biopsies were superficial.  Patient does have a history of prior radiation therapy for prostate cancer back in 2013 with excellent biochemical control at this time.  Case was presented at our tumor board and CT scan of the head and neck will be ordered.  Recommendation was for external beam radiation therapy to his larynx.  PLANNED TREATMENT REGIMEN: External beam radiation therapy to larynx  PAST MEDICAL HISTORY:  has a past medical history of Hypertrophy of prostate with urinary obstruction and other lower urinary tract symptoms (LUTS), Joint pain, Nocturia, Other and unspecified  hyperlipidemia, Prostate cancer ([place]) (05/01/12), Radiation (10/02/2012 - 11/27/2012), and Wears dentures.    PAST SURGICAL HISTORY:  Past Surgical History:  Procedure Laterality Date  . APPENDECTOMY     50 yrs ago  . CHOLECYSTECTOMY  1990's   lap choley  . COLONOSCOPY WITH PROPOFOL N/A 05/25/2018   Procedure: COLONOSCOPY WITH PROPOFOL;  Surgeon: Manya Silvas, MD;  Location: [facility] ENDOSCOPY;  Service: Endoscopy;  Laterality: N/A;  . DIRECT LARYNGOSCOPY N/A 11/19/2019   Procedure: DIRECT LARYNGOSCOPY;  Surgeon: Clyde Canterbury, MD;  Location: [place];  Service: ENT;  Laterality: N/A;  . ESOPHAGOSCOPY    . placement fiducial markers  08/23/12   3 gold seeds implanted in prostate/Dr.Grapey,David  . POLYPECTOMY     removed from throat  . PROSTATE BIOPSY  05/01/12   adenocarcinoma  . TONSILLECTOMY     with adenoidectomy 13 yrs ago    FAMILY HISTORY: family history includes Cancer in his father; Diabetes in his brother; Heart disease in his brother.  SOCIAL HISTORY:  reports that he quit smoking about 11 years ago. His smoking use included cigarettes and cigars. He has a 50.00 pack-year smoking history. He has never used smokeless tobacco. He reports that he does not drink alcohol or use drugs.  ALLERGIES: Patient has no known allergies.  MEDICATIONS:  Current Outpatient Medications  Medication Sig Dispense Refill  . simvastatin (ZOCOR) 20 MG tablet Take 20 mg by mouth daily.     No current facility-administered medications for this encounter.    ECOG PERFORMANCE STATUS:  0 - Asymptomatic  REVIEW OF SYSTEMS: Patient denies any weight loss,  fatigue, weakness, fever, chills or night sweats. Patient denies any loss of vision, blurred vision. Patient denies any ringing  of the ears or hearing loss. No irregular heartbeat. Patient denies heart murmur or history of fainting. Patient denies any chest pain or pain radiating to her upper extremities. Patient denies any shortness of  breath, difficulty breathing at night, cough or hemoptysis. Patient denies any swelling in the lower legs. Patient denies any nausea vomiting, vomiting of blood, or coffee ground material in the vomitus. Patient denies any stomach pain. Patient states has had normal bowel movements no significant constipation or diarrhea. Patient denies any dysuria, hematuria or significant nocturia. Patient denies any problems walking, swelling in the joints or loss of balance. Patient denies any skin changes, loss of hair or loss of weight. Patient denies any excessive worrying or anxiety or significant depression. Patient denies any problems with insomnia. Patient denies excessive thirst, polyuria, polydipsia. Patient denies any swollen glands, patient denies easy bruising or easy bleeding. Patient denies any recent infections, allergies or URI. Patient "s visual fields have not changed significantly in recent time.   PHYSICAL EXAM: BP (!) 144/84   Pulse 78   Temp 97.6 F (36.4 C)   Resp 20   Wt 210 lb 9.6 oz (95.5 kg)   BMI 30.22 kg/m  No evidence of cervical or supraclavicular adenopathy is identified.  Well-developed well-nourished patient in NAD. HEENT reveals PERLA, EOMI, discs not visualized.  Oral cavity is clear. No oral mucosal lesions are identified. Neck is clear without evidence of cervical or supraclavicular adenopathy. Lungs are clear to A&P. Cardiac examination is essentially unremarkable with regular rate and rhythm without murmur rub or thrill. Abdomen is benign with no organomegaly or masses noted. Motor sensory and DTR levels are equal and symmetric in the upper and lower extremities. Cranial nerves II through XII are grossly intact. Proprioception is intact. No peripheral adenopathy or edema is identified. No motor or sensory levels are noted. Crude visual fields are within normal range.  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  I am ordering a CT scan of the head and neck region for baseline study and to rule out any possibility of nodal involvement.  I would plan on delivering 6600 cGy in 33 fractions using 3-dimensional treatment planning risks and benefits of treatment occluding worsening of his hoarseness temporarily possible dysphagia fatigue skin reaction all were discussed in detail with the patient and his wife.  Also the risk of some slight lymphedema of his neck.  Patient comprehends my treatment plan well.  I have set him up personally for CT simulation after his CT scan of the head and neck.  I would like to take this opportunity to thank you for allowing me to participate in the care of your patient.Noreene Filbert, MD

## 2019-12-31 ENCOUNTER — Ambulatory Visit: Payer: Medicare Other

## 2019-12-31 ENCOUNTER — Other Ambulatory Visit: Payer: Self-pay | Admitting: *Deleted

## 2019-12-31 DIAGNOSIS — J383 Other diseases of vocal cords: Secondary | ICD-10-CM

## 2020-01-01 ENCOUNTER — Other Ambulatory Visit
Admission: RE | Admit: 2020-01-01 | Discharge: 2020-01-01 | Disposition: A | Discharge status: Home / Self Care | Payer: Medicare Other | Source: Ambulatory Visit | Attending: Radiation Oncology | Admitting: Radiation Oncology

## 2020-01-01 ENCOUNTER — Other Ambulatory Visit: Payer: Self-pay

## 2020-01-01 ENCOUNTER — Ambulatory Visit
Admission: RE | Admit: 2020-01-01 | Discharge: 2020-01-01 | Disposition: A | Discharge status: Home / Self Care | Payer: Medicare Other | Source: Ambulatory Visit | Attending: Radiation Oncology | Admitting: Radiation Oncology

## 2020-01-01 DIAGNOSIS — J383 Other diseases of vocal cords: Secondary | ICD-10-CM | POA: Diagnosis present

## 2020-01-01 LAB — CREATININE, SERUM
Creatinine, Ser: 1 mg/dL (ref 0.61–1.24)
GFR calc Af Amer: >60 mL/min (ref >60)
GFR calc non Af Amer: >60 mL/min (ref >60)

## 2020-01-01 MED ORDER — IOHEXOL 300 MG/ML  SOLN
75.0000 mL | Freq: Once | INTRAMUSCULAR | Status: AC | PRN
  Administered 2020-01-01: 75 mL via INTRAVENOUS

## 2020-01-02 ENCOUNTER — Ambulatory Visit: Payer: Medicare Other

## 2020-01-02 ENCOUNTER — Other Ambulatory Visit: Payer: Self-pay

## 2020-01-02 DIAGNOSIS — C329 Malignant neoplasm of larynx, unspecified: Secondary | ICD-10-CM | POA: Diagnosis present

## 2020-01-02 DIAGNOSIS — Z510 Encounter for antineoplastic radiation therapy: Secondary | ICD-10-CM | POA: Diagnosis present

## 2020-01-08 DIAGNOSIS — Z510 Encounter for antineoplastic radiation therapy: Secondary | ICD-10-CM | POA: Diagnosis not present

## 2020-01-09 ENCOUNTER — Ambulatory Visit: Admission: RE | Admit: 2020-01-09 | Payer: Medicare Other | Source: Ambulatory Visit

## 2020-01-09 DIAGNOSIS — Z510 Encounter for antineoplastic radiation therapy: Secondary | ICD-10-CM | POA: Diagnosis not present

## 2020-01-13 ENCOUNTER — Ambulatory Visit
Admission: RE | Admit: 2020-01-13 | Discharge: 2020-01-13 | Disposition: A | Discharge status: Home / Self Care | Payer: Medicare Other | Source: Ambulatory Visit | Attending: Radiation Oncology | Admitting: Radiation Oncology

## 2020-01-13 DIAGNOSIS — Z510 Encounter for antineoplastic radiation therapy: Secondary | ICD-10-CM | POA: Diagnosis not present

## 2020-01-14 ENCOUNTER — Ambulatory Visit
Admission: RE | Admit: 2020-01-14 | Discharge: 2020-01-14 | Disposition: A | Discharge status: Home / Self Care | Payer: Medicare Other | Source: Ambulatory Visit | Attending: Radiation Oncology | Admitting: Radiation Oncology

## 2020-01-14 DIAGNOSIS — Z510 Encounter for antineoplastic radiation therapy: Secondary | ICD-10-CM | POA: Diagnosis not present

## 2020-01-15 ENCOUNTER — Ambulatory Visit
Admission: RE | Admit: 2020-01-15 | Discharge: 2020-01-15 | Disposition: A | Discharge status: Home / Self Care | Payer: Medicare Other | Source: Ambulatory Visit | Attending: Radiation Oncology | Admitting: Radiation Oncology

## 2020-01-15 DIAGNOSIS — Z510 Encounter for antineoplastic radiation therapy: Secondary | ICD-10-CM | POA: Diagnosis not present

## 2020-01-16 ENCOUNTER — Ambulatory Visit
Admission: RE | Admit: 2020-01-16 | Discharge: 2020-01-16 | Disposition: A | Discharge status: Home / Self Care | Payer: Medicare Other | Source: Ambulatory Visit | Attending: Radiation Oncology | Admitting: Radiation Oncology

## 2020-01-16 DIAGNOSIS — Z510 Encounter for antineoplastic radiation therapy: Secondary | ICD-10-CM | POA: Diagnosis not present

## 2020-01-17 ENCOUNTER — Ambulatory Visit
Admission: RE | Admit: 2020-01-17 | Discharge: 2020-01-17 | Disposition: A | Discharge status: Home / Self Care | Payer: Medicare Other | Source: Ambulatory Visit | Attending: Radiation Oncology | Admitting: Radiation Oncology

## 2020-01-17 DIAGNOSIS — Z510 Encounter for antineoplastic radiation therapy: Secondary | ICD-10-CM | POA: Diagnosis not present

## 2020-01-20 ENCOUNTER — Ambulatory Visit
Admission: RE | Admit: 2020-01-20 | Discharge: 2020-01-20 | Disposition: A | Discharge status: Home / Self Care | Payer: Medicare Other | Source: Ambulatory Visit | Attending: Radiation Oncology | Admitting: Radiation Oncology

## 2020-01-20 DIAGNOSIS — Z510 Encounter for antineoplastic radiation therapy: Secondary | ICD-10-CM | POA: Diagnosis not present

## 2020-01-21 ENCOUNTER — Ambulatory Visit
Admission: RE | Admit: 2020-01-21 | Discharge: 2020-01-21 | Disposition: A | Discharge status: Home / Self Care | Payer: Medicare Other | Source: Ambulatory Visit | Attending: Radiation Oncology | Admitting: Radiation Oncology

## 2020-01-21 DIAGNOSIS — Z510 Encounter for antineoplastic radiation therapy: Secondary | ICD-10-CM | POA: Diagnosis not present

## 2020-01-22 ENCOUNTER — Ambulatory Visit
Admission: RE | Admit: 2020-01-22 | Discharge: 2020-01-22 | Disposition: A | Discharge status: Home / Self Care | Payer: Medicare Other | Source: Ambulatory Visit | Attending: Radiation Oncology | Admitting: Radiation Oncology

## 2020-01-22 DIAGNOSIS — Z510 Encounter for antineoplastic radiation therapy: Secondary | ICD-10-CM | POA: Diagnosis not present

## 2020-01-23 ENCOUNTER — Ambulatory Visit
Admission: RE | Admit: 2020-01-23 | Discharge: 2020-01-23 | Disposition: A | Discharge status: Home / Self Care | Payer: Medicare Other | Source: Ambulatory Visit | Attending: Radiation Oncology | Admitting: Radiation Oncology

## 2020-01-23 DIAGNOSIS — Z510 Encounter for antineoplastic radiation therapy: Secondary | ICD-10-CM | POA: Diagnosis not present

## 2020-01-24 ENCOUNTER — Ambulatory Visit
Admission: RE | Admit: 2020-01-24 | Discharge: 2020-01-24 | Disposition: A | Discharge status: Home / Self Care | Payer: Medicare Other | Source: Ambulatory Visit | Attending: Radiation Oncology | Admitting: Radiation Oncology

## 2020-01-24 DIAGNOSIS — Z510 Encounter for antineoplastic radiation therapy: Secondary | ICD-10-CM | POA: Diagnosis not present

## 2020-01-27 ENCOUNTER — Ambulatory Visit
Admission: RE | Admit: 2020-01-27 | Discharge: 2020-01-27 | Disposition: A | Discharge status: Home / Self Care | Payer: Medicare Other | Source: Ambulatory Visit | Attending: Radiation Oncology | Admitting: Radiation Oncology

## 2020-01-27 DIAGNOSIS — Z510 Encounter for antineoplastic radiation therapy: Secondary | ICD-10-CM | POA: Diagnosis not present

## 2020-01-28 ENCOUNTER — Ambulatory Visit
Admission: RE | Admit: 2020-01-28 | Discharge: 2020-01-28 | Disposition: A | Discharge status: Home / Self Care | Payer: Medicare Other | Source: Ambulatory Visit | Attending: Radiation Oncology | Admitting: Radiation Oncology

## 2020-01-28 ENCOUNTER — Other Ambulatory Visit: Payer: Self-pay | Admitting: *Deleted

## 2020-01-28 DIAGNOSIS — Z510 Encounter for antineoplastic radiation therapy: Secondary | ICD-10-CM | POA: Diagnosis not present

## 2020-01-28 MED ORDER — SUCRALFATE 1 G PO TABS: 1.0000 g | ORAL_TABLET | Freq: Three times a day (TID) | ORAL | 3 refills | Status: DC

## 2020-01-29 ENCOUNTER — Ambulatory Visit
Admission: RE | Admit: 2020-01-29 | Discharge: 2020-01-29 | Disposition: A | Discharge status: Home / Self Care | Payer: Medicare Other | Source: Ambulatory Visit | Attending: Radiation Oncology | Admitting: Radiation Oncology

## 2020-01-29 DIAGNOSIS — Z510 Encounter for antineoplastic radiation therapy: Secondary | ICD-10-CM | POA: Diagnosis not present

## 2020-01-30 ENCOUNTER — Ambulatory Visit
Admission: RE | Admit: 2020-01-30 | Discharge: 2020-01-30 | Disposition: A | Discharge status: Home / Self Care | Payer: Medicare Other | Source: Ambulatory Visit | Attending: Radiation Oncology | Admitting: Radiation Oncology

## 2020-01-30 DIAGNOSIS — C329 Malignant neoplasm of larynx, unspecified: Secondary | ICD-10-CM | POA: Insufficient documentation

## 2020-01-30 DIAGNOSIS — Z510 Encounter for antineoplastic radiation therapy: Secondary | ICD-10-CM | POA: Diagnosis present

## 2020-01-31 ENCOUNTER — Ambulatory Visit
Admission: RE | Admit: 2020-01-31 | Discharge: 2020-01-31 | Disposition: A | Discharge status: Home / Self Care | Payer: Medicare Other | Source: Ambulatory Visit | Attending: Radiation Oncology | Admitting: Radiation Oncology

## 2020-01-31 DIAGNOSIS — Z510 Encounter for antineoplastic radiation therapy: Secondary | ICD-10-CM | POA: Diagnosis not present

## 2020-02-03 ENCOUNTER — Ambulatory Visit
Admission: RE | Admit: 2020-02-03 | Discharge: 2020-02-03 | Disposition: A | Discharge status: Home / Self Care | Payer: Medicare Other | Source: Ambulatory Visit | Attending: Radiation Oncology | Admitting: Radiation Oncology

## 2020-02-03 DIAGNOSIS — Z510 Encounter for antineoplastic radiation therapy: Secondary | ICD-10-CM | POA: Diagnosis not present

## 2020-02-04 ENCOUNTER — Other Ambulatory Visit: Payer: Self-pay | Admitting: *Deleted

## 2020-02-04 ENCOUNTER — Ambulatory Visit
Admission: RE | Admit: 2020-02-04 | Discharge: 2020-02-04 | Disposition: A | Discharge status: Home / Self Care | Payer: Medicare Other | Source: Ambulatory Visit | Attending: Radiation Oncology | Admitting: Radiation Oncology

## 2020-02-04 DIAGNOSIS — Z510 Encounter for antineoplastic radiation therapy: Secondary | ICD-10-CM | POA: Diagnosis not present

## 2020-02-04 MED ORDER — LANSOPRAZOLE 30 MG PO CPDR: 30.0000 mg | DELAYED_RELEASE_CAPSULE | Freq: Every day | ORAL | 1 refills | Status: DC

## 2020-02-04 MED ORDER — DEXAMETHASONE 2 MG PO TABS: 2.0000 mg | ORAL_TABLET | Freq: Two times a day (BID) | ORAL | 0 refills | Status: DC

## 2020-02-05 ENCOUNTER — Ambulatory Visit
Admission: RE | Admit: 2020-02-05 | Discharge: 2020-02-05 | Disposition: A | Discharge status: Home / Self Care | Payer: Medicare Other | Source: Ambulatory Visit | Attending: Radiation Oncology | Admitting: Radiation Oncology

## 2020-02-05 DIAGNOSIS — Z510 Encounter for antineoplastic radiation therapy: Secondary | ICD-10-CM | POA: Diagnosis not present

## 2020-02-06 ENCOUNTER — Ambulatory Visit
Admission: RE | Admit: 2020-02-06 | Discharge: 2020-02-06 | Disposition: A | Discharge status: Home / Self Care | Payer: Medicare Other | Source: Ambulatory Visit | Attending: Radiation Oncology | Admitting: Radiation Oncology

## 2020-02-06 DIAGNOSIS — Z510 Encounter for antineoplastic radiation therapy: Secondary | ICD-10-CM | POA: Diagnosis not present

## 2020-02-07 ENCOUNTER — Ambulatory Visit
Admission: RE | Admit: 2020-02-07 | Discharge: 2020-02-07 | Disposition: A | Discharge status: Home / Self Care | Payer: Medicare Other | Source: Ambulatory Visit | Attending: Radiation Oncology | Admitting: Radiation Oncology

## 2020-02-07 DIAGNOSIS — Z510 Encounter for antineoplastic radiation therapy: Secondary | ICD-10-CM | POA: Diagnosis not present

## 2020-02-10 ENCOUNTER — Ambulatory Visit
Admission: RE | Admit: 2020-02-10 | Discharge: 2020-02-10 | Disposition: A | Discharge status: Home / Self Care | Payer: Medicare Other | Source: Ambulatory Visit | Attending: Radiation Oncology | Admitting: Radiation Oncology

## 2020-02-10 DIAGNOSIS — Z510 Encounter for antineoplastic radiation therapy: Secondary | ICD-10-CM | POA: Diagnosis not present

## 2020-02-11 ENCOUNTER — Ambulatory Visit
Admission: RE | Admit: 2020-02-11 | Discharge: 2020-02-11 | Disposition: A | Discharge status: Home / Self Care | Payer: Medicare Other | Source: Ambulatory Visit | Attending: Radiation Oncology | Admitting: Radiation Oncology

## 2020-02-11 DIAGNOSIS — Z510 Encounter for antineoplastic radiation therapy: Secondary | ICD-10-CM | POA: Diagnosis not present

## 2020-02-12 ENCOUNTER — Ambulatory Visit
Admission: RE | Admit: 2020-02-12 | Discharge: 2020-02-12 | Disposition: A | Discharge status: Home / Self Care | Payer: Medicare Other | Source: Ambulatory Visit | Attending: Radiation Oncology | Admitting: Radiation Oncology

## 2020-02-12 DIAGNOSIS — Z510 Encounter for antineoplastic radiation therapy: Secondary | ICD-10-CM | POA: Diagnosis not present

## 2020-02-13 ENCOUNTER — Ambulatory Visit
Admission: RE | Admit: 2020-02-13 | Discharge: 2020-02-13 | Disposition: A | Discharge status: Home / Self Care | Payer: Medicare Other | Source: Ambulatory Visit | Attending: Radiation Oncology | Admitting: Radiation Oncology

## 2020-02-13 DIAGNOSIS — Z510 Encounter for antineoplastic radiation therapy: Secondary | ICD-10-CM | POA: Diagnosis not present

## 2020-02-14 ENCOUNTER — Ambulatory Visit
Admission: RE | Admit: 2020-02-14 | Discharge: 2020-02-14 | Disposition: A | Discharge status: Home / Self Care | Payer: Medicare Other | Source: Ambulatory Visit | Attending: Radiation Oncology | Admitting: Radiation Oncology

## 2020-02-14 DIAGNOSIS — Z510 Encounter for antineoplastic radiation therapy: Secondary | ICD-10-CM | POA: Diagnosis not present

## 2020-02-17 ENCOUNTER — Ambulatory Visit
Admission: RE | Admit: 2020-02-17 | Discharge: 2020-02-17 | Disposition: A | Discharge status: Home / Self Care | Payer: Medicare Other | Source: Ambulatory Visit | Attending: Radiation Oncology | Admitting: Radiation Oncology

## 2020-02-17 DIAGNOSIS — Z510 Encounter for antineoplastic radiation therapy: Secondary | ICD-10-CM | POA: Diagnosis not present

## 2020-02-18 ENCOUNTER — Ambulatory Visit
Admission: RE | Admit: 2020-02-18 | Discharge: 2020-02-18 | Disposition: A | Discharge status: Home / Self Care | Payer: Medicare Other | Source: Ambulatory Visit | Attending: Radiation Oncology | Admitting: Radiation Oncology

## 2020-02-18 DIAGNOSIS — Z510 Encounter for antineoplastic radiation therapy: Secondary | ICD-10-CM | POA: Diagnosis not present

## 2020-02-19 ENCOUNTER — Ambulatory Visit
Admission: RE | Admit: 2020-02-19 | Discharge: 2020-02-19 | Disposition: A | Discharge status: Home / Self Care | Payer: Medicare Other | Source: Ambulatory Visit | Attending: Radiation Oncology | Admitting: Radiation Oncology

## 2020-02-19 DIAGNOSIS — Z510 Encounter for antineoplastic radiation therapy: Secondary | ICD-10-CM | POA: Diagnosis not present

## 2020-02-20 ENCOUNTER — Ambulatory Visit
Admission: RE | Admit: 2020-02-20 | Discharge: 2020-02-20 | Disposition: A | Discharge status: Home / Self Care | Payer: Medicare Other | Source: Ambulatory Visit | Attending: Radiation Oncology | Admitting: Radiation Oncology

## 2020-02-20 DIAGNOSIS — Z510 Encounter for antineoplastic radiation therapy: Secondary | ICD-10-CM | POA: Diagnosis not present

## 2020-02-21 ENCOUNTER — Ambulatory Visit
Admission: RE | Admit: 2020-02-21 | Discharge: 2020-02-21 | Disposition: A | Discharge status: Home / Self Care | Payer: Medicare Other | Source: Ambulatory Visit | Attending: Radiation Oncology | Admitting: Radiation Oncology

## 2020-02-21 DIAGNOSIS — Z510 Encounter for antineoplastic radiation therapy: Secondary | ICD-10-CM | POA: Diagnosis not present

## 2020-02-24 ENCOUNTER — Ambulatory Visit
Admission: RE | Admit: 2020-02-24 | Discharge: 2020-02-24 | Disposition: A | Discharge status: Home / Self Care | Payer: Medicare Other | Source: Ambulatory Visit | Attending: Radiation Oncology | Admitting: Radiation Oncology

## 2020-02-24 DIAGNOSIS — Z510 Encounter for antineoplastic radiation therapy: Secondary | ICD-10-CM | POA: Diagnosis not present

## 2020-02-25 ENCOUNTER — Ambulatory Visit
Admission: RE | Admit: 2020-02-25 | Discharge: 2020-02-25 | Disposition: A | Discharge status: Home / Self Care | Payer: Medicare Other | Source: Ambulatory Visit | Attending: Radiation Oncology | Admitting: Radiation Oncology

## 2020-02-25 DIAGNOSIS — Z510 Encounter for antineoplastic radiation therapy: Secondary | ICD-10-CM | POA: Diagnosis not present

## 2020-02-26 ENCOUNTER — Ambulatory Visit
Admission: RE | Admit: 2020-02-26 | Discharge: 2020-02-26 | Disposition: A | Discharge status: Home / Self Care | Payer: Medicare Other | Source: Ambulatory Visit | Attending: Radiation Oncology | Admitting: Radiation Oncology

## 2020-02-26 DIAGNOSIS — Z510 Encounter for antineoplastic radiation therapy: Secondary | ICD-10-CM | POA: Diagnosis not present

## 2020-03-04 ENCOUNTER — Other Ambulatory Visit: Payer: Self-pay | Admitting: *Deleted

## 2020-03-04 MED ORDER — DEXAMETHASONE 2 MG PO TABS: 2.0000 mg | ORAL_TABLET | Freq: Two times a day (BID) | ORAL | 0 refills | Status: DC

## 2020-03-10 DIAGNOSIS — C329 Malignant neoplasm of larynx, unspecified: Secondary | ICD-10-CM | POA: Insufficient documentation

## 2020-03-31 ENCOUNTER — Other Ambulatory Visit: Payer: Self-pay

## 2020-04-01 ENCOUNTER — Ambulatory Visit
Admission: RE | Admit: 2020-04-01 | Discharge: 2020-04-01 | Disposition: A | Discharge status: Home / Self Care | Payer: Medicare Other | Source: Ambulatory Visit | Attending: Radiation Oncology | Admitting: Radiation Oncology

## 2020-04-01 ENCOUNTER — Other Ambulatory Visit: Payer: Self-pay | Admitting: *Deleted

## 2020-04-01 VITALS — BP 129/78 | HR 82 | Temp 96.7°F | Resp 16 | Wt 188.3 lb

## 2020-04-01 DIAGNOSIS — J383 Other diseases of vocal cords: Secondary | ICD-10-CM

## 2020-04-01 DIAGNOSIS — Z923 Personal history of irradiation: Secondary | ICD-10-CM | POA: Diagnosis not present

## 2020-04-01 NOTE — Progress Notes (Signed)
Radiation Oncology Follow up Note  Name: [name]   Date:   04/01/2020 MRN:  [id number] DOB: [birth year]    This 79 y.o. 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 Seen today in routine follow-up he is doing well. Still has some slight dysphagia although markedly improving. His taste is normal. He is having no head and neck pain..  COMPLICATIONS OF TREATMENT: none  FOLLOW UP COMPLIANCE: keeps appointments   PHYSICAL EXAM:  BP 129/78   Pulse 82   Temp (!) 96.7 F (35.9 C)   Resp 16   Wt 188 lb 4.8 oz (85.4 kg)   SpO2 97%   BMI 27.02 kg/m  Neck is clear without evidence of cervical or supraclavicular adenopathy. Skin is fine. Well-developed well-nourished patient in NAD. HEENT reveals PERLA, EOMI, discs not visualized.  Oral cavity is clear. No oral mucosal lesions are identified. Neck is clear without evidence of cervical or supraclavicular adenopathy. Lungs are clear to A&P. Cardiac examination is essentially unremarkable with regular rate and rhythm without murmur rub or thrill. Abdomen is benign with no organomegaly or masses noted. Motor sensory and DTR levels are equal and symmetric in the upper and lower extremities. Cranial nerves II through XII are grossly intact. Proprioception is intact. No peripheral adenopathy or edema is identified. No motor or sensory levels are noted. Crude visual fields are within normal range.  RADIOLOGY RESULTS: CT scan of the head and neck ordered in 3 to 4 months  PLAN: Present time of asked him to make a follow-up appointment with his ENT doctor Dr. Richardson Landry. I have asked to see him back in about 3 to  4 months with a CT scan of the head and neck prior to that visit. Patient knows to call with any concerns at any time.  I would like to take this opportunity to thank you for allowing me to participate in the care of your patient.Noreene Filbert, MD

## 2020-07-29 ENCOUNTER — Other Ambulatory Visit: Payer: Self-pay

## 2020-07-29 ENCOUNTER — Ambulatory Visit
Admission: RE | Admit: 2020-07-29 | Discharge: 2020-07-29 | Disposition: A | Discharge status: Home / Self Care | Payer: Medicare Other | Source: Ambulatory Visit | Attending: Radiation Oncology | Admitting: Radiation Oncology

## 2020-07-29 DIAGNOSIS — J383 Other diseases of vocal cords: Secondary | ICD-10-CM | POA: Diagnosis present

## 2020-07-29 LAB — POCT I-STAT CREATININE: Creatinine, Ser: 0.9 mg/dL (ref 0.61–1.24)

## 2020-07-29 MED ORDER — IOHEXOL 300 MG/ML  SOLN
75.0000 mL | Freq: Once | INTRAMUSCULAR | Status: AC | PRN
  Administered 2020-07-29: 75 mL via INTRAVENOUS

## 2020-08-03 ENCOUNTER — Other Ambulatory Visit: Payer: Self-pay

## 2020-08-03 ENCOUNTER — Ambulatory Visit
Admission: RE | Admit: 2020-08-03 | Discharge: 2020-08-03 | Disposition: A | Discharge status: Home / Self Care | Payer: Medicare Other | Source: Ambulatory Visit | Attending: Radiation Oncology | Admitting: Radiation Oncology

## 2020-08-03 ENCOUNTER — Encounter: Payer: Self-pay | Admitting: Radiation Oncology

## 2020-08-03 VITALS — BP 142/69 | HR 70 | Temp 96.3°F | Wt 190.0 lb

## 2020-08-03 DIAGNOSIS — C760 Malignant neoplasm of head, face and neck: Secondary | ICD-10-CM

## 2020-08-03 NOTE — Progress Notes (Signed)
Radiation Oncology Follow up Note  Name: [name]   Date:   08/03/2020 MRN:  [id number] DOB: May 27, 1941    This 79 y.o. male presents to the clinic today for 37-month follow-up status post external beam radiation therapy for high-grade dysplasia at least of the larynx.  REFERRING PROVIDER: Rusty Aus, MD  HPI: Patient is an 79 year old male now out 5 months having completed external beam radiation therapy to his larynx for high-grade dysplasia which cannot rule out invasive squamous cell carcinoma seen today in routine follow-up he is doing well his voice tonality is good he is having no specific dysphagia.  He has been having routine upper endoscopy by Dr. Richardson Landry showing no evidence of disease.  He had a recent CT scan which I have reviewed.  Showing post radiation changes in the larynx and anterior soft tissue the neck no evidence of recurrent disease or adenopathy.  COMPLICATIONS OF TREATMENT: none  FOLLOW UP COMPLIANCE: keeps appointments   PHYSICAL EXAM:  BP (!) 142/69   Pulse 70   Temp (!) 96.3 F (35.7 C) (Tympanic)   Wt 190 lb (86.2 kg)   BMI 27.26 kg/m  Neck is clear without evidence of cervical or supraclavicular adenopathy.  Well-developed well-nourished patient in NAD. HEENT reveals PERLA, EOMI, discs not visualized.  Oral cavity is clear. No oral mucosal lesions are identified. Neck is clear without evidence of cervical or supraclavicular adenopathy. Lungs are clear to A&P. Cardiac examination is essentially unremarkable with regular rate and rhythm without murmur rub or thrill. Abdomen is benign with no organomegaly or masses noted. Motor sensory and DTR levels are equal and symmetric in the upper and lower extremities. Cranial nerves II through XII are grossly intact. Proprioception is intact. No peripheral adenopathy or edema is identified. No motor or sensory levels are noted. Crude visual fields are within normal range.  RADIOLOGY RESULTS: CT scan reviewed  compatible with above-stated findings  PLAN: Present time patient is doing well with no evidence of disease by both endoscopy and CT criteria.  And pleased with his overall progress.  I have asked to see him back in 6 months for follow-up.  Patient knows to call meantime with any concerns.  He continues close follow-up care with Dr. Richardson Landry.  I would like to take this opportunity to thank you for allowing me to participate in the care of your patient.Noreene Filbert, MD

## 2020-09-10 IMAGING — CT CT NECK W/ CM
2 of 3 series · 8 of 14 positions shown, 9 images · IV contrast (omnipaque)
Comparison: None.

CLINICAL DATA: Initial staging of vocal cord cancer. Bilateral
vocal cord lesions biopsied on 11/19/2019 with pathology revealing
at least high-grade dysplasia.

EXAM:
CT NECK WITH CONTRAST
TECHNIQUE: Multidetector CT imaging of the neck was performed using the
standard protocol following the bolus administration of intravenous
contrast.
CONTRAST:  75mL OMNIPAQUE IOHEXOL 300 MG/ML  SOLN

[Series 2: axial neck · axial · 0.64mm/px · z∈[-428,-280]mm · 4 of 124 slices shown]
[im 25/124  bone]
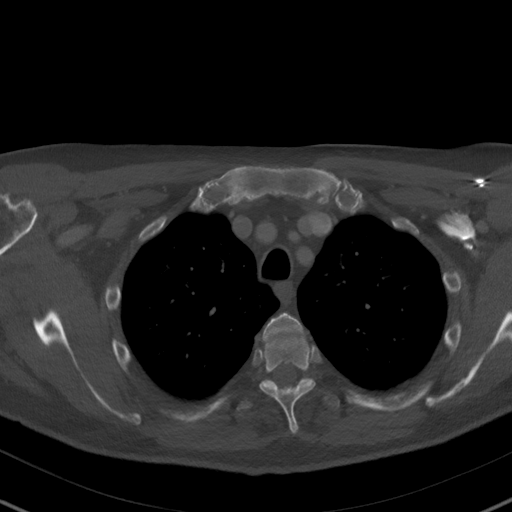
[im 50/124  bone]
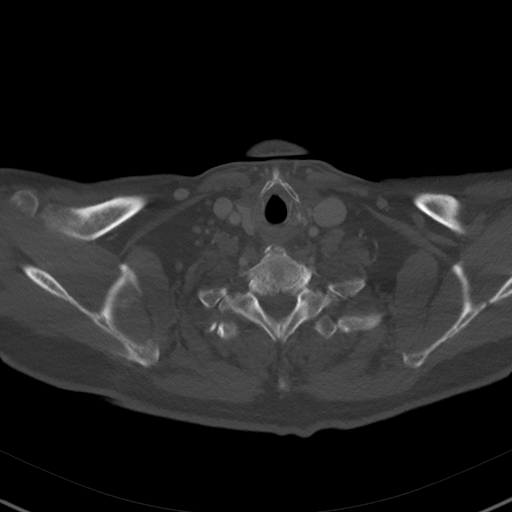
[im 74/124  bone]
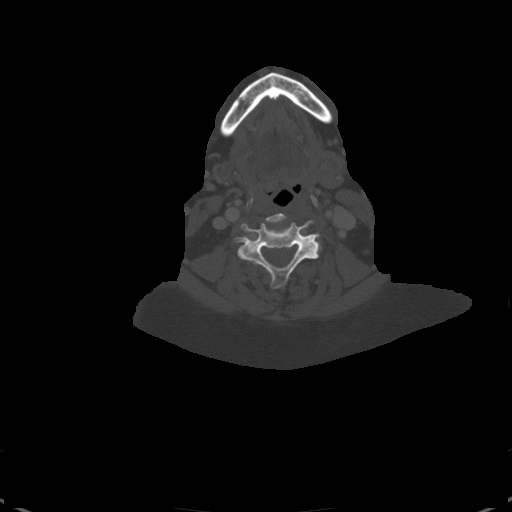
[im 99/124  bone]
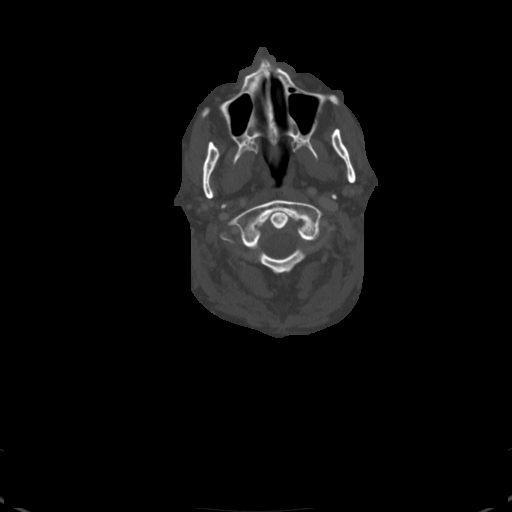

[Series 7: orthogonal ax · axial · 0.40mm/px · z∈[-485,-308]mm · 4 of 155 slices shown, 5 images]
[im 31/155  soft-tissue]
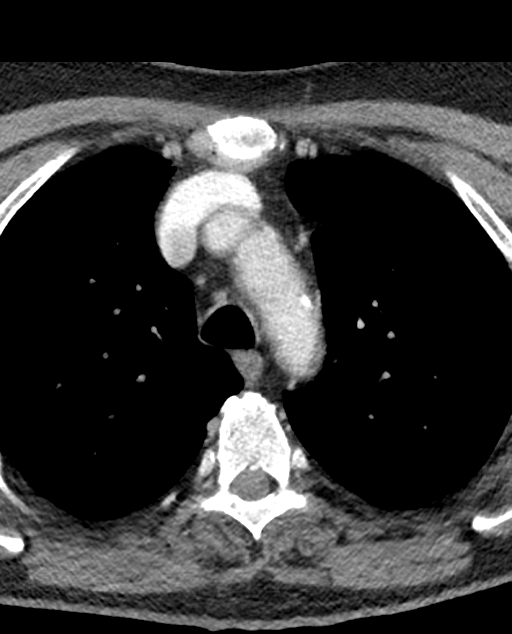
[im 31/155  bone]
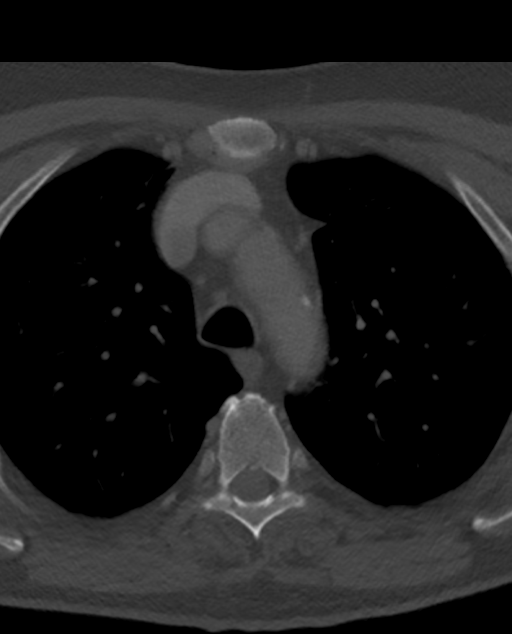
[im 62/155  bone]
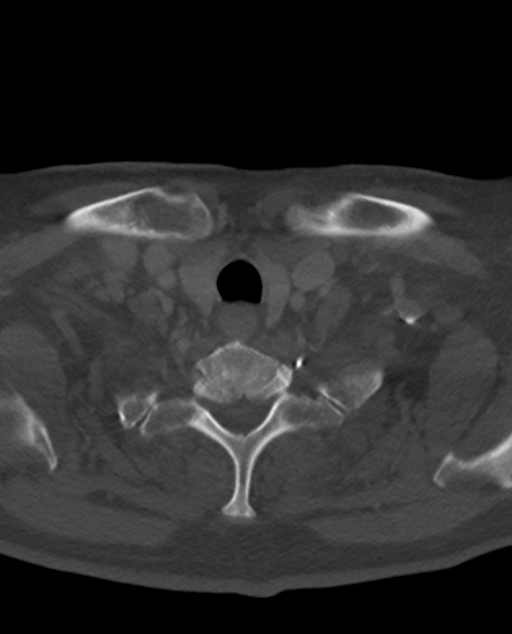
[im 93/155  bone]
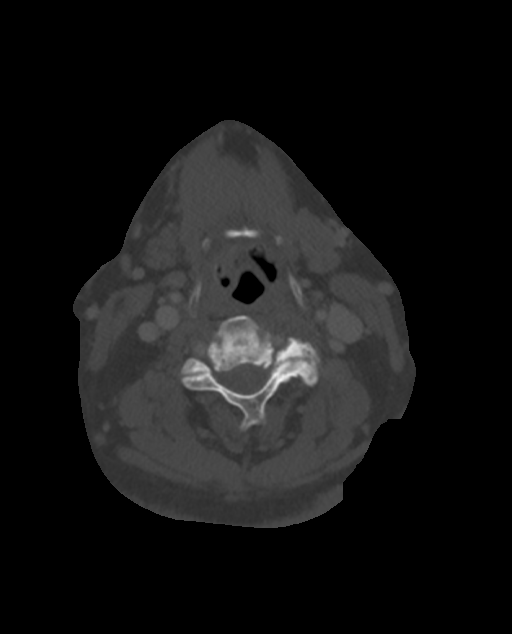
[im 124/155  bone]
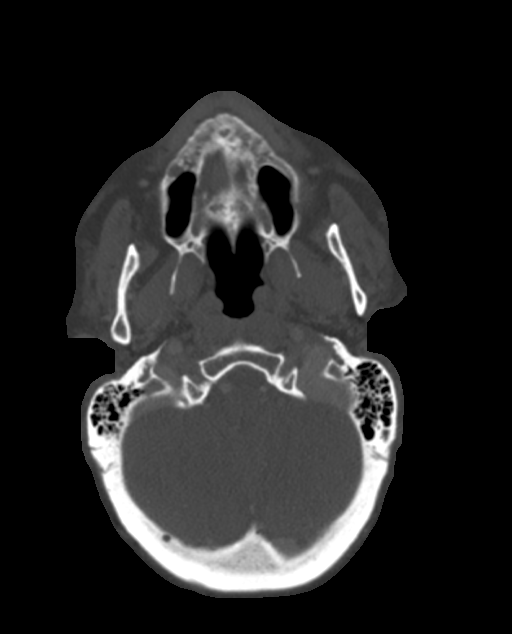

[8 of 14 positions shown; findings below may reference images not displayed]

FINDINGS: Pharynx and larynx: The glottis is closed, and the known vocal cord
lesions are not readily apparent by CT although there is slight
asymmetric left subglottic fullness. No evidence of pharyngeal mass
or swelling.

Salivary glands: No inflammation, mass, or stone.

Thyroid: Unremarkable.

Lymph nodes: No enlarged or suspicious lymph nodes in the neck.

Vascular: Aortic atherosclerosis. Major vascular structures of the
neck are patent. Left greater than right carotid bifurcation
atherosclerosis without evidence of significant stenosis.

Limited intracranial: Unremarkable.

Visualized orbits: Unremarkable.

Mastoids and visualized paranasal sinuses: Clear.

Skeleton: Edentulous. Moderate cervical disc degeneration. Advanced
left facet arthrosis at C3-4. No suspicious osseous lesion.

Upper chest: Clear lung apices. Partially visualized calcified lymph
nodes in the lateral AP window region, also present on a 4880 chest
CT.

Other: None.
IMPRESSION: Known vocal cord primary is not clearly demonstrated by CT. No
evidence of metastatic disease in the neck.

## 2021-01-25 ENCOUNTER — Telehealth: Payer: Self-pay | Admitting: *Deleted

## 2021-01-25 NOTE — Telephone Encounter (Signed)
Returned patient's call, left message that the patient does not need xray or CT per Dr. Baruch Gouty.  Dr. Baruch Gouty wants the patient to make sure he is following up with Dr. Richardson Landry. I left this information on his voicemail.

## 2021-01-25 NOTE — Telephone Encounter (Signed)
Patient called stating that he has an appointment 4/4 and that he needs to have an xray before he comes or there is no need to come to appointment. Please return his call

## 2021-01-27 NOTE — Telephone Encounter (Signed)
Patient called back again this morning and left a message on my voice mail asking same question of does he need a scan before his appointment. When I called him back, I got his voice mail as Tamika did on Monday and I left message same as Tamika and asked that he call me back.

## 2021-02-01 ENCOUNTER — Ambulatory Visit: Payer: Medicare Other | Admitting: Radiation Oncology

## 2021-04-08 IMAGING — CT CT NECK W/ CM
4 of 5 series · 14 of 33 positions shown, 16 images · IV contrast (omnipaque)
Comparison: CT neck 01/01/2020

CLINICAL DATA: History of vocal cord cancer with radiation.

EXAM:
CT NECK WITH CONTRAST
TECHNIQUE: Multidetector CT imaging of the neck was performed using the
standard protocol following the bolus administration of intravenous
contrast.
CONTRAST:  75mL OMNIPAQUE IOHEXOL 300 MG/ML  SOLN

[Series 3: axial bone neck 2.00 · axial · 0.66mm/px · z∈[-739,-611]mm · 3 of 128 slices shown, 4 images]
[im 32/128  soft-tissue]
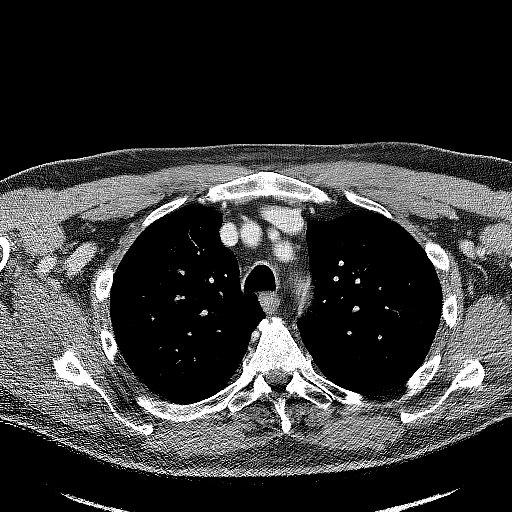
[im 32/128  bone]
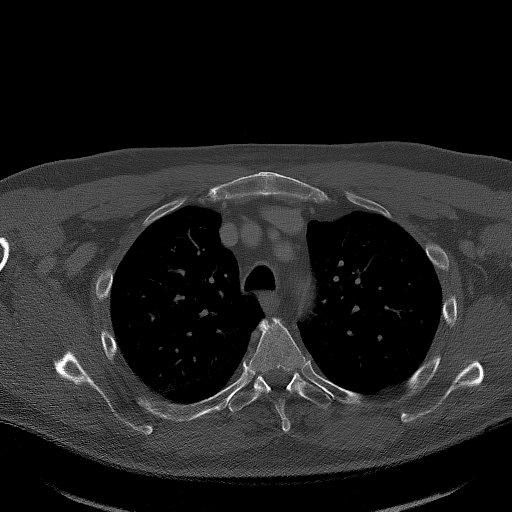
[im 64/128  bone]
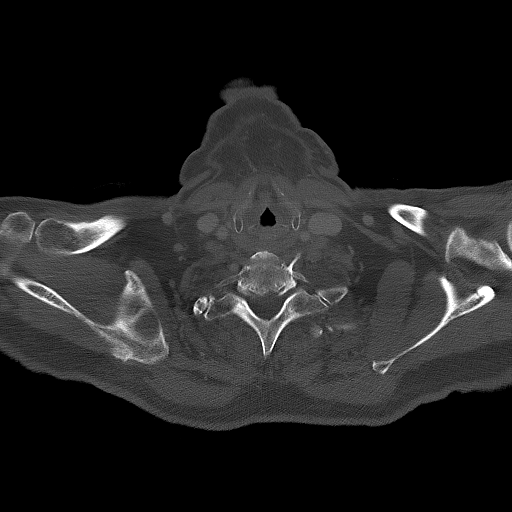
[im 96/128  bone]
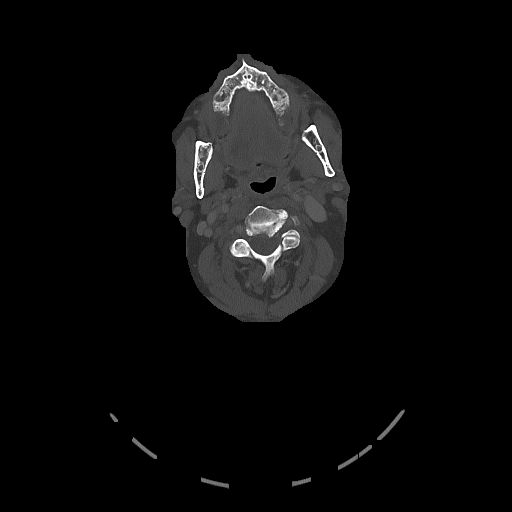

[Series 4: coronal neck neck (person_name) 2.00 cor · coronal · 0.50mm/px · 3 of 155 slices shown]
[im 31/155  bone]
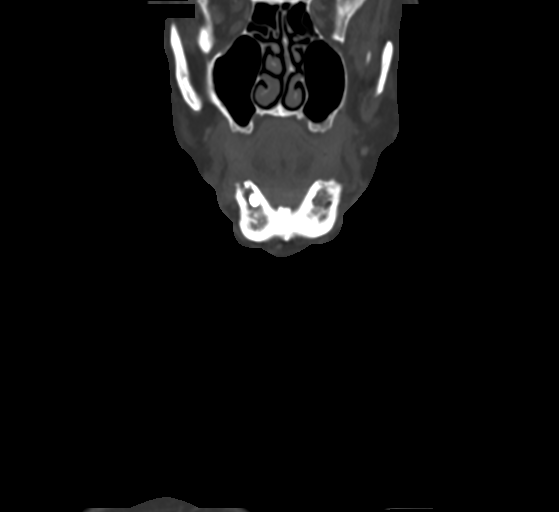
[im 62/155  bone]
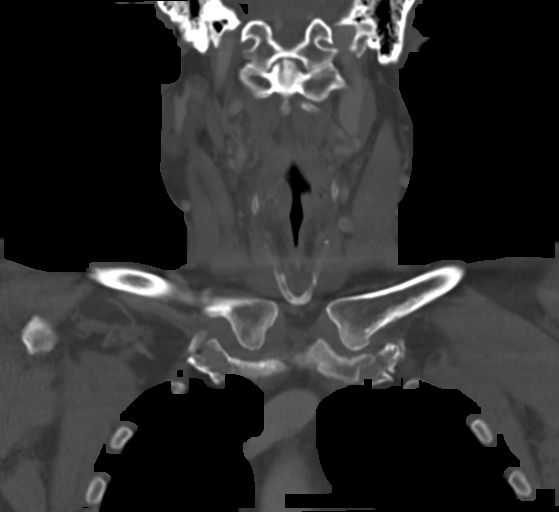
[im 93/155  bone]
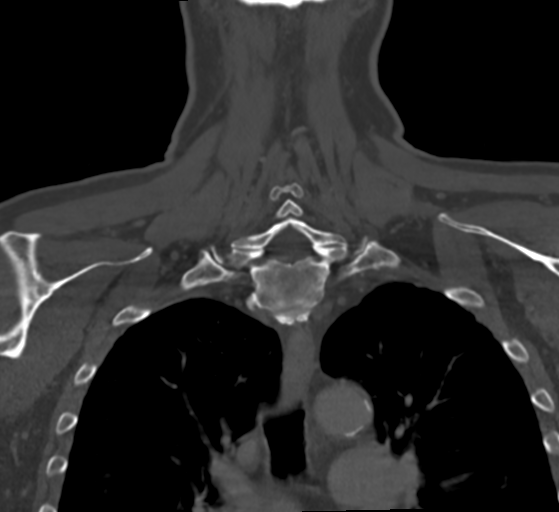

[Series 6: sagittal neck neck (person_name) 2.00 sag · sagittal · 0.50mm/px · 5 of 139 slices shown, 6 images]
[im 47/139  bone]
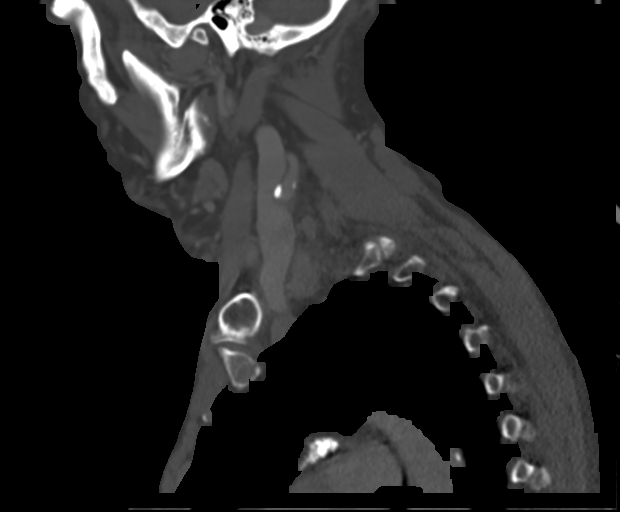
[im 58/139  bone]
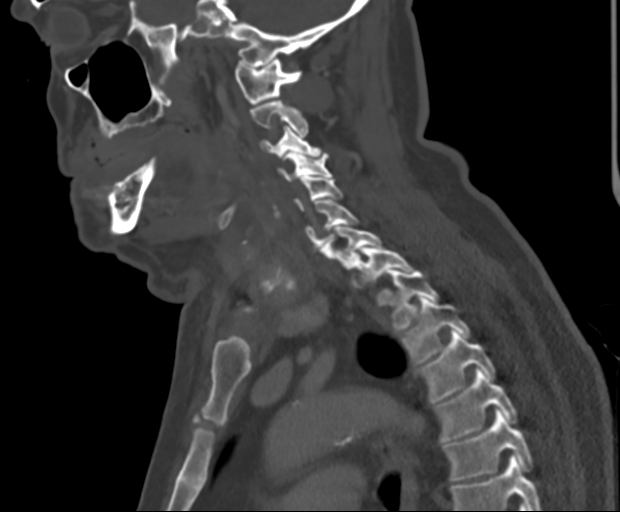
[im 70/139  soft-tissue]
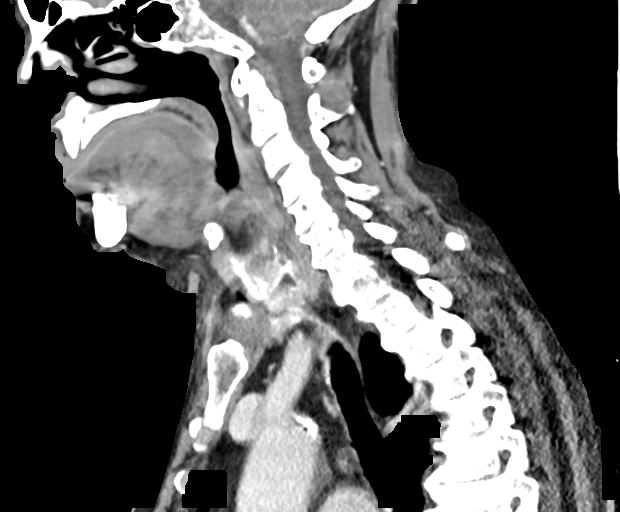
[im 70/139  bone]
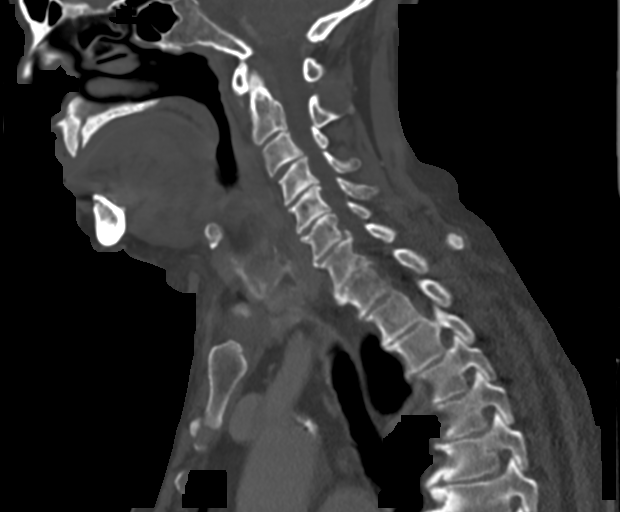
[im 81/139  bone]
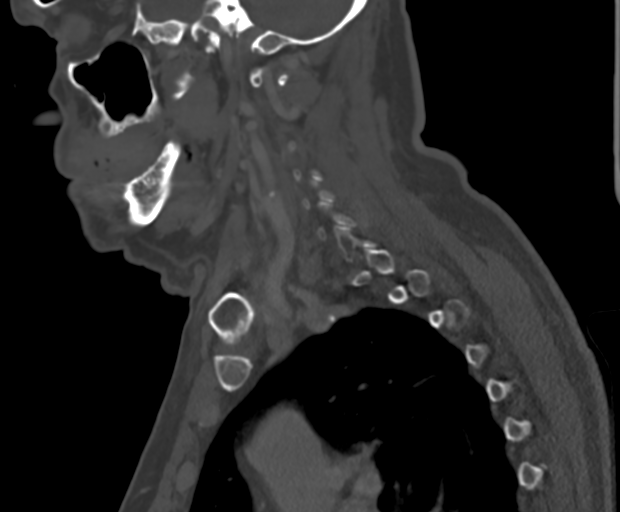
[im 93/139  bone]
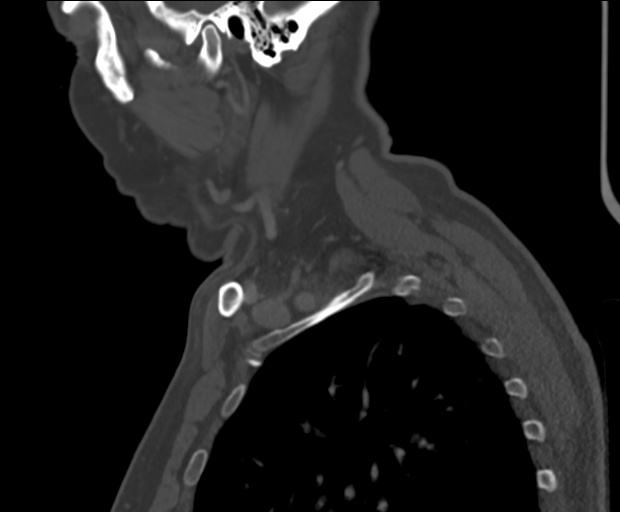

[Series 8: ax oropharynx neck neck (person_name) 2.00 ax · axial · 0.55mm/px · z∈[-736,-608]mm · 3 of 128 slices shown]
[im 32/128  bone]
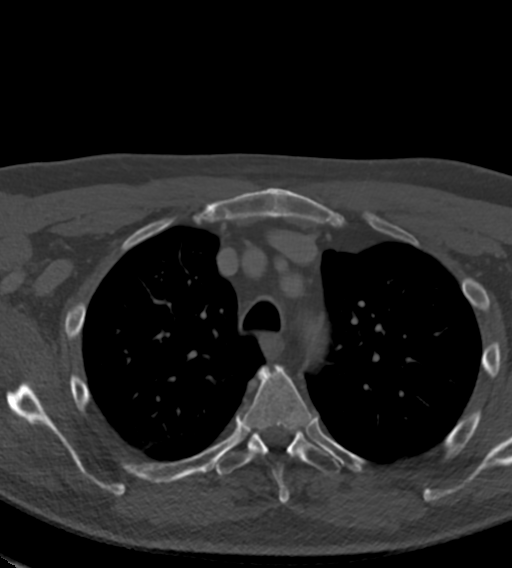
[im 64/128  bone]
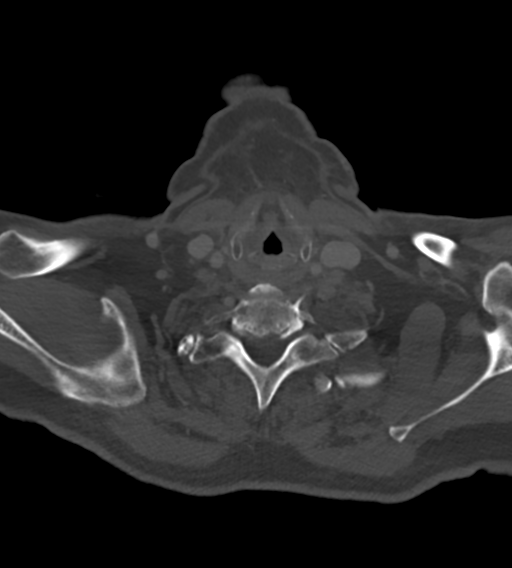
[im 96/128  bone]
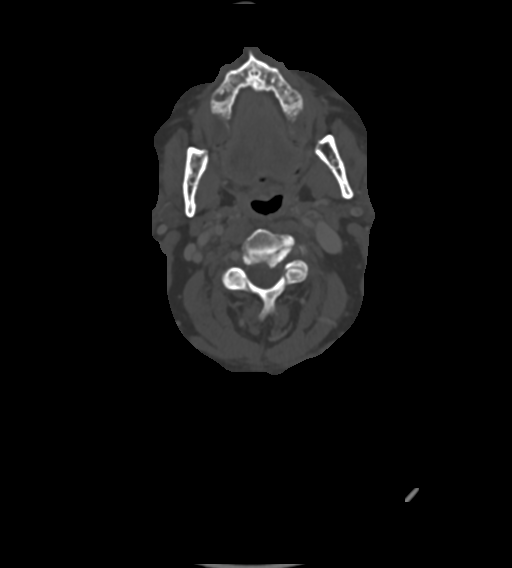

[14 of 33 positions shown; findings below may reference images not displayed]

FINDINGS: Pharynx and larynx: Mucosal edema involving the larynx and
epiglottis. Findings compatible with recent radiation change. No
mass lesion or airway compromise. Nasopharynx and oropharynx appear
normal.

Salivary glands: Mild hyperenhancement in the submandibular gland
bilaterally due to radiation change. Negative parotid bilaterally.

Thyroid: Negative

Lymph nodes: Negative for enlarged lymph nodes in the neck.

Vascular: Normal vascular enhancement. Mild atherosclerotic
calcification in the aortic arch and carotid bifurcation
bilaterally.

Limited intracranial: Negative

Visualized orbits: Negative

Mastoids and visualized paranasal sinuses: Paranasal sinuses clear.
Mastoid clear.

Skeleton: Cervical spondylosis.  No acute skeletal abnormality.

Upper chest: Lung apices clear bilaterally.

Other: Stranding in the subcutaneous tissues in the anterior neck
due to radiation change.
IMPRESSION: Post radiation changes in the larynx and anterior soft tissues of
the neck. No recurrent tumor. No adenopathy.
